# Patient Record
Sex: Male | Born: 1945 | Race: White | Hispanic: No | Marital: Married | Smoking: Never smoker
Health system: Southern US, Community
[De-identification: ages and names within clinical notes are randomized; demographics above are authoritative.]

## PROBLEM LIST (undated history)

## (undated) DIAGNOSIS — I10 Essential (primary) hypertension: Secondary | ICD-10-CM

## (undated) DIAGNOSIS — Z8249 Family history of ischemic heart disease and other diseases of the circulatory system: Secondary | ICD-10-CM

## (undated) HISTORY — DX: Family history of ischemic heart disease and other diseases of the circulatory system: Z82.49

---

## 2015-02-17 ENCOUNTER — Emergency Department
Admission: EM | Admit: 2015-02-17 | Discharge: 2015-02-17 | Disposition: A | Discharge status: Home / Self Care | Payer: Medicare Other | Attending: Emergency Medicine | Admitting: Emergency Medicine

## 2015-02-17 ENCOUNTER — Encounter: Payer: Self-pay | Admitting: Emergency Medicine

## 2015-02-17 ENCOUNTER — Emergency Department: Payer: Medicare Other

## 2015-02-17 DIAGNOSIS — R1032 Left lower quadrant pain: Secondary | ICD-10-CM | POA: Diagnosis not present

## 2015-02-17 DIAGNOSIS — I10 Essential (primary) hypertension: Secondary | ICD-10-CM | POA: Insufficient documentation

## 2015-02-17 DIAGNOSIS — M5416 Radiculopathy, lumbar region: Secondary | ICD-10-CM | POA: Insufficient documentation

## 2015-02-17 DIAGNOSIS — R109 Unspecified abdominal pain: Secondary | ICD-10-CM | POA: Diagnosis present

## 2015-02-17 HISTORY — DX: Essential (primary) hypertension: I10

## 2015-02-17 LAB — CBC WITH DIFFERENTIAL/PLATELET
BASOS ABS: 0.1 10*3/uL (ref 0–0.1)
EOS ABS: 0 10*3/uL (ref 0–0.7)
Eosinophils Relative: 0 %
HCT: 50.8 % (ref 40.0–52.0)
Hemoglobin: 17.1 g/dL (ref 13.0–18.0)
Lymphocytes Relative: 10 %
Lymphs Abs: 1 10*3/uL (ref 1.0–3.6)
MCH: 27.3 pg (ref 26.0–34.0)
MCHC: 33.6 g/dL (ref 32.0–36.0)
MCV: 81.2 fL (ref 80.0–100.0)
Monocytes Absolute: 0.6 10*3/uL (ref 0.2–1.0)
Monocytes Relative: 6 %
Neutro Abs: 7.8 10*3/uL — ABNORMAL HIGH (ref 1.4–6.5)
Neutrophils Relative %: 83 %
Platelets: 217 10*3/uL (ref 150–440)
RBC: 6.26 MIL/uL — AB (ref 4.40–5.90)
RDW: 14.5 % (ref 11.5–14.5)
WBC: 9.4 10*3/uL (ref 3.8–10.6)

## 2015-02-17 LAB — BASIC METABOLIC PANEL
ANION GAP: 9 (ref 5–15)
BUN: 15 mg/dL (ref 6–20)
CALCIUM: 10.1 mg/dL (ref 8.9–10.3)
CO2: 23 mmol/L (ref 22–32)
CREATININE: 1.04 mg/dL (ref 0.61–1.24)
Chloride: 104 mmol/L (ref 101–111)
GFR calc Af Amer: >60 mL/min (ref >60)
GFR calc non Af Amer: >60 mL/min (ref >60)
Glucose, Bld: 123 mg/dL — ABNORMAL HIGH (ref 65–99)
Potassium: 4 mmol/L (ref 3.5–5.1)
Sodium: 136 mmol/L (ref 135–145)

## 2015-02-17 LAB — URINALYSIS COMPLETE WITH MICROSCOPIC (ARMC ONLY)
Bacteria, UA: NONE SEEN
Bilirubin Urine: NEGATIVE
Glucose, UA: NEGATIVE mg/dL
Hgb urine dipstick: NEGATIVE
KETONES UR: NEGATIVE mg/dL
Leukocytes, UA: NEGATIVE
NITRITE: NEGATIVE
PH: 5 (ref 5.0–8.0)
PROTEIN: NEGATIVE mg/dL
Specific Gravity, Urine: 1.013 (ref 1.005–1.030)

## 2015-02-17 LAB — LIPASE, BLOOD: Lipase: 30 U/L (ref 22–51)

## 2015-02-17 MED ORDER — HYDROCODONE-ACETAMINOPHEN 5-325 MG PO TABS: 1.0000 | ORAL_TABLET | Freq: Four times a day (QID) | ORAL | Status: DC | PRN

## 2015-02-17 MED ORDER — IBUPROFEN 800 MG PO TABS: 800.0000 mg | ORAL_TABLET | Freq: Three times a day (TID) | ORAL | Status: DC | PRN

## 2015-02-17 MED ORDER — IBUPROFEN 600 MG PO TABS
ORAL_TABLET | ORAL | Status: AC
  Administered 2015-02-17: 600 mg via ORAL
  Filled 2015-02-17: qty 1

## 2015-02-17 MED ORDER — IBUPROFEN 600 MG PO TABS
600.0000 mg | ORAL_TABLET | Freq: Once | ORAL | Status: AC
  Administered 2015-02-17: 600 mg via ORAL

## 2015-02-17 NOTE — ED Notes (Signed)
Pt reports that he has been having left flank pain that radiates to the LLQ. He denies any GU symptom or blood in urine. Denies V/D but has been getting nauseated.

## 2015-02-17 NOTE — Discharge Instructions (Signed)
No certain cause was found for your left back pain which wraps around to the front. However your exam and evaluation are reassuring. I suspect a pinched nerve in the back called radiculopathy. You're being started on anti-inflammatory ibuprofen regimen 800 mg 3 times per day for 1 week and then as needed for pain. I suggest following up with primary care provider you're referred to primary care clinic. You may need further evaluation with imaging such as an MRI. Return to the emergency department for any new or worsening pain weakness numbness or incontinence.

## 2015-02-17 NOTE — ED Provider Notes (Signed)
[facility] Emergency Department Provider Note    ____________________________________________  Time seen: 11 AM  I have reviewed the triage vital signs and the nursing notes.   HISTORY  Chief Complaint Flank Pain       HPI Alex Hughes is a 69 y.o. male patient who presents with left lower quadrant and left flank pain for several days on and off. At times is severe never goes completely away nothing makes it better or worse. Never had this before. Does not radiate down the leg. No specific lower spine pain. Denies urinary symptoms     Past Medical History  Diagnosis Date  . Hypertension     There are no active problems to display for this patient.   History reviewed. No pertinent past surgical history.  No current outpatient prescriptions on file.  Allergies Sulfur  History reviewed. No pertinent family history.  Social History History  Substance Use Topics  . Smoking status: Never Smoker   . Smokeless tobacco: Not on file  . Alcohol Use: No    Review of Systems  Constitutional: Negative for fever. Eyes: Negative for visual changes. ENT: Negative for sore throat. Cardiovascular: Negative for chest pain. Respiratory: Negative for shortness of breath. Gastrointestinal: Negative vomiting and diarrhea. Genitourinary: Negative for dysuria. Musculoskeletal: Negative for back pain. Does report left flank pain Skin: Negative for rash. Neurological: Negative for headaches, focal weakness or numbness.   10-point ROS otherwise negative.  ____________________________________________   PHYSICAL EXAM:  VITAL SIGNS: ED Triage Vitals  Enc Vitals Group     BP 02/17/15 0835 178/117 mmHg     Pulse Rate 02/17/15 0835 90     Resp 02/17/15 0835 20     Temp 02/17/15 0835 98.3 F (36.8 C)     Temp Source 02/17/15 0835 Oral     SpO2 02/17/15 0835 97 %     Weight 02/17/15 0835 215 lb (97.523 kg)     Height 02/17/15 0835 5' 11"  (1.803  m)     Head Cir --      Peak Flow --      Pain Score 02/17/15 0836 7     Pain Loc --      Pain Edu? --      Excl. in [place]? --      Constitutional: Alert and oriented. Well appearing and in no distress. Eyes: Conjunctivae are normal. PERRL. Normal extraocular movements. ENT   Head: Normocephalic and atraumatic.   Nose: No congestion/rhinnorhea.   Mouth/Throat: Mucous membranes are moist.   Neck: No stridor. Hematological/Lymphatic/Immunilogical: No cervical lymphadenopathy. Cardiovascular: Normal rate, regular rhythm. Normal and symmetric distal pulses are present in all extremities. No murmurs, rubs, or gallops. Respiratory: Normal respiratory effort without tachypnea nor retractions. Breath sounds are clear and equal bilaterally. No wheezes/rales/rhonchi. Gastrointestinal: Soft mild tenderness left lower quadrant. No distention. No abdominal bruits. There is no CVA tenderness. Genitourinary: Musculoskeletal: Nontender with normal range of motion in all extremities. No joint effusions.  No lower extremity tenderness nor edema. Neurologic:  Normal speech and language. No gross focal neurologic deficits are appreciated. Speech is normal. No gait instability. Skin:  Skin is warm, dry and intact. No rash noted. Psychiatric: Mood and affect are normal. Speech and behavior are normal. Patient exhibits appropriate insight and judgment.  ____________________________________________    LABS (pertinent positives/negatives)  CBC and met b no acute other modalities  ____________________________________________   EKG    ____________________________________________    RADIOLOGY  Reviewed radiology results CT  stone study of the abdomen negative for stone and negative for acute cause of left-sided flank and abdominal pain  ____________________________________________   PROCEDURES  Procedure(s) performed: None  Critical Care performed:  No  ____________________________________________   INITIAL IMPRESSION / ASSESSMENT AND PLAN / ED COURSE  Pertinent labs & imaging results that were available during my care of the patient were reviewed by me and considered in my medical decision making (see chart for details).  Patient presented with several weeks of left-sided lower back and/or flank pain right wrapping around into the left-sided abdomen. He thinks he has been actually worsening. Doesn't feel like previous sciatica to him and there are no weakness numbness incontinence symptoms. Based on symptoms I felt I ureterolithiasis was a possible cause. Did not suspect acute aortic emergency. His labs are reassuring and his noncontrast CT the abdomen is also reassuring. I discussed this with the patient and let him know that I suspect radiculopathy including a pinched nerve in the upper portion of the lumbar spine. He can be referred to a primary care physician. I discussed return precautions with the patient we will treat conservatively with anti-inflammatory ibuprofen and short-term narcotic pain control.  ____________________________________________   FINAL CLINICAL IMPRESSION(S) / ED DIAGNOSES  Final diagnoses:  Radiculopathy of lumbar region     Lisa Roca, MD 02/17/15 1404

## 2015-08-24 ENCOUNTER — Emergency Department
Admission: EM | Admit: 2015-08-24 | Discharge: 2015-08-24 | Disposition: A | Discharge status: Home / Self Care | Payer: Medicare Other | Attending: Emergency Medicine | Admitting: Emergency Medicine

## 2015-08-24 ENCOUNTER — Encounter: Payer: Self-pay | Admitting: Emergency Medicine

## 2015-08-24 DIAGNOSIS — Y9289 Other specified places as the place of occurrence of the external cause: Secondary | ICD-10-CM | POA: Insufficient documentation

## 2015-08-24 DIAGNOSIS — Z79899 Other long term (current) drug therapy: Secondary | ICD-10-CM | POA: Insufficient documentation

## 2015-08-24 DIAGNOSIS — Z23 Encounter for immunization: Secondary | ICD-10-CM | POA: Diagnosis not present

## 2015-08-24 DIAGNOSIS — Y9389 Activity, other specified: Secondary | ICD-10-CM | POA: Diagnosis not present

## 2015-08-24 DIAGNOSIS — W231XXA Caught, crushed, jammed, or pinched between stationary objects, initial encounter: Secondary | ICD-10-CM | POA: Insufficient documentation

## 2015-08-24 DIAGNOSIS — S61210A Laceration without foreign body of right index finger without damage to nail, initial encounter: Secondary | ICD-10-CM | POA: Diagnosis not present

## 2015-08-24 DIAGNOSIS — Z7982 Long term (current) use of aspirin: Secondary | ICD-10-CM | POA: Insufficient documentation

## 2015-08-24 DIAGNOSIS — I10 Essential (primary) hypertension: Secondary | ICD-10-CM | POA: Diagnosis not present

## 2015-08-24 DIAGNOSIS — Y998 Other external cause status: Secondary | ICD-10-CM | POA: Diagnosis not present

## 2015-08-24 MED ORDER — TRAMADOL HCL 50 MG PO TABS: 50.0000 mg | ORAL_TABLET | Freq: Four times a day (QID) | ORAL | Status: DC | PRN

## 2015-08-24 MED ORDER — BACITRACIN-NEOMYCIN-POLYMYXIN 400-5-5000 EX OINT: TOPICAL_OINTMENT | Freq: Once | CUTANEOUS | Status: DC

## 2015-08-24 MED ORDER — LIDOCAINE HCL (PF) 1 % IJ SOLN
INTRAMUSCULAR | Status: AC
  Filled 2015-08-24: qty 5

## 2015-08-24 MED ORDER — TETANUS-DIPHTHERIA TOXOIDS TD 5-2 LFU IM INJ
0.5000 mL | INJECTION | Freq: Once | INTRAMUSCULAR | Status: AC
  Administered 2015-08-24: 0.5 mL via INTRAMUSCULAR
  Filled 2015-08-24: qty 0.5

## 2015-08-24 NOTE — ED Notes (Signed)
Pt presents with laceration to right index finger, sliced on a car door.

## 2015-08-24 NOTE — ED Provider Notes (Signed)
[facility] Emergency Department Provider Note  ____________________________________________  Time seen: Approximately 8:21 AM  I have reviewed the triage vital signs and the nursing notes.   HISTORY  Chief Complaint Laceration    HPI Alex Hughes is a 69 y.o. male pain and laceration to the right index finger secondary to being slammed in a truck door. Incident occurred prior to arrival. Patient denies any loss sensation or loss of function of the affected digit. Patient is right-hand dominant. Patient rates the pain as a 4/10. Except for pressure dressing no other palliative measures performed. Patient states tetanus is not up-to-date.   Past Medical History  Diagnosis Date  . Hypertension     There are no active problems to display for this patient.   History reviewed. No pertinent past surgical history.  Current Outpatient Rx  Name  Route  Sig  Dispense  Refill  . amLODipine (NORVASC) 5 MG tablet   Oral   Take 5 mg by mouth daily.         [name] aspirin 81 MG tablet   Oral   Take 81 mg by mouth daily.         [name] HYDROcodone-acetaminophen (NORCO/VICODIN) 5-325 MG per tablet   Oral   Take 1-2 tablets by mouth every 6 (six) hours as needed for moderate pain.   10 tablet   0   . ibuprofen (ADVIL,MOTRIN) 800 MG tablet   Oral   Take 1 tablet (800 mg total) by mouth every 8 (eight) hours as needed.   30 tablet   0   . metoprolol tartrate (LOPRESSOR) 25 MG tablet   Oral   Take 25 mg by mouth 2 (two) times daily.            Allergies Lisinopril and Sulfur  No family history on file.  Social History Social History  Substance Use Topics  . Smoking status: Never Smoker   . Smokeless tobacco: None  . Alcohol Use: No    Review of Systems Constitutional: No fever/chills Eyes: No visual changes. ENT: No sore throat. Cardiovascular: Denies chest pain. Respiratory: Denies shortness of breath. Gastrointestinal: No abdominal pain.  No  nausea, no vomiting.  No diarrhea.  No constipation. Genitourinary: Negative for dysuria. Musculoskeletal: Negative for back pain. Skin: Negative for rash. Neurological: Negative for headaches, focal weakness or numbness. Endocrine:Hypertension Hematological/Lymphatic: Allergic/Immunilogical: The medication  10-point ROS otherwise negative.  ____________________________________________   PHYSICAL EXAM:  VITAL SIGNS: ED Triage Vitals  Enc Vitals Group     BP 08/24/15 0732 164/97 mmHg     Pulse Rate 08/24/15 0732 73     Resp 08/24/15 0732 20     Temp 08/24/15 0732 98.3 F (36.8 C)     Temp Source 08/24/15 0732 Oral     SpO2 08/24/15 0732 97 %     Weight 08/24/15 0732 215 lb (97.523 kg)     Height 08/24/15 0732 5\' 11"  (1.803 m)     Head Cir --      Peak Flow --      Pain Score --      Pain Loc --      Pain Edu? --      Excl. in [place]? --     Constitutional: Alert and oriented. Well appearing and in no acute distress. Eyes: Conjunctivae are normal. PERRL. EOMI. Head: Atraumatic. Nose: No congestion/rhinnorhea. Mouth/Throat: Mucous membranes are moist.  Oropharynx non-erythematous. Neck: No stridor.  No cervical spine tenderness to palpation. Hematological/Lymphatic/Immunilogical:  No cervical lymphadenopathy. Cardiovascular: Normal rate, regular rhythm. Grossly normal heart sounds.  Good peripheral circulation. Limited blood pressure Respiratory: Normal respiratory effort.  No retractions. Lungs CTAB. Gastrointestinal: Soft and nontender. No distention. No abdominal bruits. No CVA tenderness. Musculoskeletal: No lower extremity tenderness nor edema.  No joint effusions. Neurologic:  Normal speech and language. No gross focal neurologic deficits are appreciated. No gait instability. Skin:  Skin is warm, dry and intact. No rash noted. 1.5 cm laceration palmar aspect of second digit right hand. Psychiatric: Mood and affect are normal. Speech and behavior are  normal.  ____________________________________________   LABS (all labs ordered are listed, but only abnormal results are displayed)  Labs Reviewed - No data to display ____________________________________________  EKG   ____________________________________________  RADIOLOGY   ____________________________________________   PROCEDURES  Procedure(s) performed: See procedure note  Critical Care performed: No LACERATION REPAIR Performed by: Sable Feil Authorized by: Sable Feil Consent: Verbal consent obtained. Risks and benefits: risks, benefits and alternatives were discussed Consent given by: patient Patient identity confirmed: provided demographic data Prepped and Draped in normal sterile fashion Wound explored  Laceration Location: Right index finger  Laceration Length: 1.5 cm Anesthesia: local infiltration  Local anesthetic: lidocaine 1% without epinephrine  Anesthetic total: 5 ML Irrigation method: syringe Amount of cleaning: standard  Skin closure: 4-0 nylon  Number of sutures: 11 Technique: Interrupted  Patient tolerance: Patient tolerated the procedure well with no immediate complications.  ____________________________________________   INITIAL IMPRESSION / ASSESSMENT AND PLAN / ED COURSE  Pertinent labs & imaging results that were available during my care of the patient were reviewed by me and considered in my medical decision making (see chart for details).  Laceration index finger right hand. Area was was sutured and patient remained neurovascularly intact status post procedure. Patient given discharge instructions home care and advised return back in 10 days for suture removal. Patient given prescription for tramadol take as needed for pain. Patient advised return back to ER sooner if his wound reopens. ____________________________________________   FINAL CLINICAL IMPRESSION(S) / ED DIAGNOSES  Final diagnoses:  Laceration of right index  finger w/o foreign body w/o damage to nail, initial encounter      Sable Feil, PA-C 08/24/15 0900  Carrie Mew, MD 08/25/15 [phone]

## 2015-08-24 NOTE — ED Notes (Addendum)
States he shut his right index finger in truck door this am  Laceration noted finger  approx 1 in.

## 2015-08-24 NOTE — Discharge Instructions (Signed)
Laceration Care, Adult  A laceration is a cut that goes through all layers of the skin. The cut also goes into the tissue that is right under the skin. Some cuts heal on their own. Others need to be closed with stitches (sutures), staples, skin adhesive strips, or wound glue. Taking care of your cut lowers your risk of infection and helps your cut to heal better.  HOW TO TAKE CARE OF YOUR CUT  For stitches or staples:  · Keep the wound clean and dry.  · If you were given a bandage (dressing), you should change it at least one time per day or as told by your doctor. You should also change it if it gets wet or dirty.  · Keep the wound completely dry for the first 24 hours or as told by your doctor. After that time, you may take a shower or a bath. However, make sure that the wound is not soaked in water until after the stitches or staples have been removed.  · Clean the wound one time each day or as told by your doctor:    Wash the wound with soap and water.    Rinse the wound with water until all of the soap comes off.    Pat the wound dry with a clean towel. Do not rub the wound.  · After you clean the wound, put a thin layer of antibiotic ointment on it as told by your doctor. This ointment:    Helps to prevent infection.    Keeps the bandage from sticking to the wound.  · Have your stitches or staples removed as told by your doctor.  If your doctor used skin adhesive strips:   · Keep the wound clean and dry.  · If you were given a bandage, you should change it at least one time per day or as told by your doctor. You should also change it if it gets dirty or wet.  · Do not get the skin adhesive strips wet. You can take a shower or a bath, but be careful to keep the wound dry.  · If the wound gets wet, pat it dry with a clean towel. Do not rub the wound.  · Skin adhesive strips fall off on their own. You can trim the strips as the wound heals. Do not remove any strips that are still stuck to the wound. They will  fall off after a while.  If your doctor used wound glue:  · Try to keep your wound dry, but you may briefly wet it in the shower or bath. Do not soak the wound in water, such as by swimming.  · After you take a shower or a bath, gently pat the wound dry with a clean towel. Do not rub the wound.  · Do not do any activities that will make you really sweaty until the skin glue has fallen off on its own.  · Do not apply liquid, cream, or ointment medicine to your wound while the skin glue is still on.  · If you were given a bandage, you should change it at least one time per day or as told by your doctor. You should also change it if it gets dirty or wet.  · If a bandage is placed over the wound, do not let the tape for the bandage touch the skin glue.  · Do not pick at the glue. The skin glue usually stays on for 5-10 days. Then, it   falls off of the skin.  General Instructions   · To help prevent scarring, make sure to cover your wound with sunscreen whenever you are outside after stitches are removed, after adhesive strips are removed, or when wound glue stays in place and the wound is healed. Make sure to wear a sunscreen of at least 30 SPF.  · Take over-the-counter and prescription medicines only as told by your doctor.  · If you were given antibiotic medicine or ointment, take or apply it as told by your doctor. Do not stop using the antibiotic even if your wound is getting better.  · Do not scratch or pick at the wound.  · Keep all follow-up visits as told by your doctor. This is important.  · Check your wound every day for signs of infection. Watch for:    Redness, swelling, or pain.    Fluid, blood, or pus.  · Raise (elevate) the injured area above the level of your heart while you are sitting or lying down, if possible.  GET HELP IF:  · You got a tetanus shot and you have any of these problems at the injection site:    Swelling.    Very bad pain.    Redness.    Bleeding.  · You have a fever.  · A wound that was  closed breaks open.  · You notice a bad smell coming from your wound or your bandage.  · You notice something coming out of the wound, such as wood or glass.  · Medicine does not help your pain.  · You have more redness, swelling, or pain at the site of your wound.  · You have fluid, blood, or pus coming from your wound.  · You notice a change in the color of your skin near your wound.  · You need to change the bandage often because fluid, blood, or pus is coming from the wound.  · You start to have a new rash.  · You start to have numbness around the wound.  GET HELP RIGHT AWAY IF:  · You have very bad swelling around the wound.  · Your pain suddenly gets worse and is very bad.  · You notice painful lumps near the wound or on skin that is anywhere on your body.  · You have a red streak going away from your wound.  · The wound is on your hand or foot and you cannot move a finger or toe like you usually can.  · The wound is on your hand or foot and you notice that your fingers or toes look pale or bluish.     This information is not intended to replace advice given to you by your health care provider. Make sure you discuss any questions you have with your health care provider.     Document Released: 03/21/2008 Document Revised: 02/17/2015 Document Reviewed: 09/29/2014  Elsevier Interactive Patient Education ©2016 [employer]

## 2015-09-02 ENCOUNTER — Ambulatory Visit
Admission: EM | Admit: 2015-09-02 | Discharge: 2015-09-02 | Disposition: A | Discharge status: Home / Self Care | Payer: Medicare Other

## 2015-09-02 NOTE — ED Notes (Signed)
For suture removal-sutures right 2nd finger. Wound fairly well healed. 10 sutures removed without difficulty and steri strips applied.

## 2016-11-30 ENCOUNTER — Encounter: Payer: Self-pay | Admitting: Family Medicine

## 2016-11-30 ENCOUNTER — Ambulatory Visit (INDEPENDENT_AMBULATORY_CARE_PROVIDER_SITE_OTHER): Payer: PPO | Admitting: Family Medicine

## 2016-11-30 DIAGNOSIS — I10 Essential (primary) hypertension: Secondary | ICD-10-CM | POA: Diagnosis not present

## 2016-11-30 DIAGNOSIS — Z8249 Family history of ischemic heart disease and other diseases of the circulatory system: Secondary | ICD-10-CM | POA: Diagnosis not present

## 2016-11-30 DIAGNOSIS — E663 Overweight: Secondary | ICD-10-CM | POA: Diagnosis not present

## 2016-11-30 DIAGNOSIS — H9312 Tinnitus, left ear: Secondary | ICD-10-CM

## 2016-11-30 DIAGNOSIS — M5431 Sciatica, right side: Secondary | ICD-10-CM | POA: Diagnosis not present

## 2016-11-30 DIAGNOSIS — H9319 Tinnitus, unspecified ear: Secondary | ICD-10-CM | POA: Insufficient documentation

## 2016-11-30 DIAGNOSIS — M5416 Radiculopathy, lumbar region: Secondary | ICD-10-CM | POA: Insufficient documentation

## 2016-11-30 HISTORY — DX: Family history of ischemic heart disease and other diseases of the circulatory system: Z82.49

## 2016-11-30 MED ORDER — NAPROXEN 500 MG PO TABS: 500.0000 mg | ORAL_TABLET | Freq: Two times a day (BID) | ORAL | 0 refills | Status: DC

## 2016-11-30 NOTE — Progress Notes (Signed)
Date:  11/30/2016   Name:  Alex Hughes   DOB:  November 27, 1945   MRN:  [id number]  PCP:  Adline Potter, MD    Chief Complaint: Establish Care and Leg Pain (Pt complains of shooting pain from hip down to the "ball of this foot of Rt leg. This was a previous problem from 2 years ago.)   History of Present Illness:  This is a 71 y.o. male c/o 3 week hx R back and leg pain worse with standing, radiates to R foot, better with sitting. Had similar sxs two years ago, seen ED, CT showed non-obstructing kidney stones and lumbar DJD only, sxs resolved with ibuprofen. Ibuprofen 400 mg bid helping some now. Hx HTN and tinnitus followed by [place], did not take usual BP meds this PM, BP at home usually 130s/70s. Father died CVA during CABG age 56, mother died 31s from worry, sister healthy. Imms UTD through [place], does annual FOBT, last seen at [place] 6 months ago.  Review of Systems:  Review of Systems  Constitutional: Negative for chills and fever.  HENT: Negative for ear pain and sore throat.   Eyes: Negative for pain.  Respiratory: Negative for cough and shortness of breath.   Cardiovascular: Negative for chest pain and leg swelling.  Gastrointestinal: Negative for abdominal pain.  Endocrine: Negative for polydipsia and polyuria.  Genitourinary: Negative for dysuria.  Musculoskeletal: Negative for myalgias.  Neurological: Negative for tremors, syncope and light-headedness.    Patient Active Problem List   Diagnosis Date Noted  . Right sided sciatica 11/30/2016  . Tinnitus 11/30/2016  . Hypertension 11/30/2016  . FH: heart disease 11/30/2016  . Overweight (BMI 25.0-29.9) 11/30/2016    Prior to Admission medications   Medication Sig Start Date End Date Taking? Authorizing Provider  amLODipine (NORVASC) 5 MG tablet Take 5 mg by mouth. One tablet every morning and half tablet every afternoon   Yes Historical Provider, MD  metoprolol tartrate (LOPRESSOR) 25 MG tablet Take 25 mg by mouth 2 (two) times daily.     Yes Historical Provider, MD  naproxen (NAPROSYN) 500 MG tablet Take 1 tablet (500 mg total) by mouth 2 (two) times daily with a meal. 11/30/16   Adline Potter, MD    Allergies  Allergen Reactions  . Lisinopril     Facial Swelling  . Sulfur Other (See Comments)    Urinated blood    History reviewed. No pertinent surgical history.  Social History  Substance Use Topics  . Smoking status: Never Smoker  . Smokeless tobacco: Never Used  . Alcohol use No    Family History  Problem Relation Age of Onset  . Diabetes Father   . Heart disease Father     Medication list has been reviewed and updated.  Physical Examination: BP (!) 158/96   Pulse 68   Ht 6' (1.829 m)   Wt 205 lb (93 kg)   SpO2 97%   BMI 27.80 kg/m   Physical Exam  Constitutional: He is oriented to person, place, and time. He appears well-developed and well-nourished.  HENT:  Head: Normocephalic and atraumatic.  Right Ear: External ear normal.  Left Ear: External ear normal.  Nose: Nose normal.  Mouth/Throat: Oropharynx is clear and moist.  TMs clear  Eyes: Conjunctivae and EOM are normal. Pupils are equal, round, and reactive to light. No scleral icterus.  Neck: Normal range of motion. Neck supple.  Cardiovascular: Normal rate, regular rhythm and normal heart sounds.   Pulmonary/Chest: Effort normal and  breath sounds normal.  Abdominal: Soft. He exhibits no distension and no mass. There is no tenderness.  Musculoskeletal: Normal range of motion. He exhibits no edema.  SLR positive with dorsiflexion on R, negative on L No pain with bent knee flexion or hip rotation  Neurological: He is alert and oriented to person, place, and time. Coordination normal.  Skin: Skin is warm and dry.  Psychiatric: He has a normal mood and affect. His behavior is normal.  Nursing note and vitals reviewed.   Assessment and Plan:  1. Right sided sciatica Naprosyn bid x 2 wks only, call if sxs worsen - Ambulatory referral  to Physical Therapy  2. [facility] of left ear [facility]  3. Essential hypertension Poor control today but ok at home, did not take PM meds, monitor  4. Overweight (BMI 25.0-29.9) Exercise/weight loss discussed  5. FH: heart disease Consider lipids if not done at [place]  Return in about 4 weeks (around 12/28/2016).   Satira Anis. [facility]  11/30/2016

## 2016-12-05 ENCOUNTER — Ambulatory Visit: Payer: PPO | Attending: Family Medicine | Admitting: Physical Therapy

## 2016-12-05 DIAGNOSIS — M5441 Lumbago with sciatica, right side: Secondary | ICD-10-CM | POA: Diagnosis not present

## 2016-12-05 DIAGNOSIS — R293 Abnormal posture: Secondary | ICD-10-CM | POA: Diagnosis not present

## 2016-12-05 DIAGNOSIS — M6281 Muscle weakness (generalized): Secondary | ICD-10-CM | POA: Diagnosis not present

## 2016-12-05 NOTE — Therapy (Signed)
[facility] [address] [place], [place], [address] Phone: [phone]   Fax:  [phone]  Physical Therapy Evaluation  Patient Details  Name: Alex Hughes MRN: [id number] Date of Birth: 04/23/1946 Referring Provider: Adline Potter, M.D.   Encounter Date: 12/05/2016      PT End of Session - 12/05/16 1536    Visit Number 1   Number of Visits 8   Date for PT Re-Evaluation 01/02/17   Authorization - Visit Number 1   Authorization - Number of Visits 10   PT Start Time 1586   PT Stop Time 1520   PT Time Calculation (min) 67 min   Activity Tolerance Patient tolerated treatment well;No increased pain   Behavior During Therapy WFL for tasks assessed/performed      Past Medical History:  Diagnosis Date  . Hypertension     No past surgical history on file.  There were no vitals filed for this visit.       Subjective Assessment - 12/05/16 1422    Subjective Pt. presented to physical therapy clinic today with no reports of current pain and no assistive device. Pt. reports worse pain as 8/10 shooting pain down R LE into R foot. Pt. states pain is often aggravated by standing up. Pt. currently works part-time and enjoys spending time with his grandkids.    Limitations Standing;Walking;Lifting;House hold activities   Patient Stated Goals Return to normal household activities    Currently in Pain? No/denies     Modified Oswestry Low Back Pain Disability Questionnaire: 40%  Seated LE MMT:   Right Left  Hip flexion 4-/5 4+/5  Hip Abduction 4+/5 4+/5  Hip Adduction 4+/5 little bit of pain 4+/5  Knee Extension  4+/5 Little bit of pain 4+/5  Knee Flexion 4+/5 4+/5  DF 5/5 5/5  PF 20 heel raises: laterally weight shift over 5th met 17 heel raises before fatigue     Standing Trunk ROM: WFL  Supine: Straight leg raise:PROM  Left: : WFL hamstring length with and w/o DF  Right: WFL hamstring length- get numbness into foot w  and w/o DF  Straight leg raise test: Active  Right: -, good ROM  Left knee to chest: no pain Right knee to chest: get slight numbness into toes Bony alignment: aligned malleoli, tibial tuberosity, and ASIS Bridge: able to perform, cramping on left side LE rotation stretch: little pull to the left, no pain to the right.  Piriformis: right piriformis tightness  Prone: B quad tightness w/ Ely's test R piriformis tender to palpation, STM to R hip/glute relieved pain  No tenderness with mod. Hypomobility of L2-L5 with grade I-II CPA/UPA  There.Ex.: Supine/seated stretching of R piriformis 30 sec. Static holds 3x each, prone press-ups 5x with 20 sec. Holds.  See handouts.       [facility] PT Assessment - 12/05/16 0001      Assessment   Medical Diagnosis Right sided sciatica    Referring Provider Adline Potter, M.D.    Onset Date/Surgical Date 11/14/16   Next MD Visit 01/04/17   Prior Therapy yes     Precautions   Precautions None     Restrictions   Weight Bearing Restrictions No     Balance Screen   Has the patient fallen in the past 6 months No   Has the patient had a decrease in activity level because of a fear of falling?  Yes   Is the patient reluctant to leave their  home because of a fear of falling?  No     Home Environment   Living Environment Private residence   Living Arrangements Spouse/significant other            PT Education - 12/05/16 1535    Education provided Yes   Education Details see handout   Person(s) Educated Patient   Methods Explanation;Demonstration;Handout   Comprehension Verbalized understanding;Returned demonstration             PT Long Term Goals - 12/05/16 1655      PT LONG TERM GOAL #1   Title Pt. will have 3/10 pain or less with sit to stand to promote pain-free mobility   Baseline Pt. reports as high as 8/10 pain with sit to stand   Time 4   Period Weeks   Status New     PT LONG TERM GOAL #2   Title Pt. will score <20% on  mODI to decrease self-perceived disability of the low back.   Baseline 2/19: 40% (severe disability)   Time 4   Period Weeks   Status New     PT LONG TERM GOAL #3   Title Pt. will demonstrate proper box lifting techniques in PT clinic with no increase in pain to promote proper body mechanics at home.   Baseline Pt. unaware of proper lifting mechanics   Time 4   Period Weeks   Status New     PT LONG TERM GOAL #4   Title Pt. will increase R hip flexion MMT to grossly 4+/5 to promote a more normalized gait pattern.    Baseline Pt. 4-/5 R hip flexion 12/05/16   Time 4   Period Weeks   Status New             Plan - 12/05/16 1538    Clinical Impression Statement Patient is a pleasant 71 year old male who presents to physical therapy evaluation for right leg pain with a hx of sciatica. 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 Right piriformis is tight with concurrent nerve pain. Patient has an antalgic gait with RLE externally rotated indicative of piriformis syndrome. Patient scored a 40% on the Modified Oswestry Low back Pain questionnaire placing him in the self perceived severe disability category. Patient has slight hypomobility of lumbar spine with tight right paraspinals and inflamed right piriformis.  Patient had a (-) Active SLR but had tingling and numbness with PROM SLR.  Pain was noted with RLE MMT. In prone, pt. had relief of back pain during back extension on elbows. Pt. educated on several methods of stretching the R piriformis muscle in both supine and seated. Patient will benefit from skilled physical therapy for pain control, muscle length, and improved capacity to stand.    Rehab Potential Good   PT Frequency 2x / week   PT Duration 4 weeks   PT Treatment/Interventions ADLs/Self Care Home Management;Cryotherapy;Electrical Stimulation;Moist Heat;Gait training;Functional mobility Scientist, forensic;Therapeutic exercise;Balance  training;Neuromuscular re-education;Patient/family education;Manual techniques;Passive range of motion;Dry needling   PT Next Visit Plan STM to R glute/hip/low back.  Review HEP.     PT Home Exercise Plan see handout    Consulted and Agree with Plan of Care Patient      Patient will benefit from skilled therapeutic intervention in order to improve the following deficits and impairments:  Abnormal gait, Decreased endurance, Decreased mobility, Hypomobility, Decreased range of motion, Improper body mechanics, Decreased activity tolerance, Decreased strength, Impaired flexibility, Postural  dysfunction, Pain  Visit Diagnosis: Acute right-sided low back pain with right-sided sciatica  Abnormal posture  Muscle weakness (generalized)      G-Codes - 12/24/2016 1732    Functional Assessment Tool Used Clinical judgement   Functional Assessment Tool Used mODI/ pain/ muscle weakness/ joint hypomobility.     Functional Limitation Mobility: Walking and moving around   Mobility: Walking and Moving Around Current Status [phone]) At least 20 percent but less than 40 percent impaired, limited or restricted   Mobility: Walking and Moving Around Goal Status [phone]) At least 1 percent but less than 20 percent impaired, limited or restricted       Problem List Patient Active Problem List   Diagnosis Date Noted  . Right sided sciatica 11/30/2016  . Tinnitus 11/30/2016  . Hypertension 11/30/2016  . FH: heart disease 11/30/2016  . Overweight (BMI 25.0-29.9) 11/30/2016   Pura Spice, PT, DPT # [id number] SPT 12/24/2016, 5:33 PM  [facility] [address] [place], [place], [address] Phone: [phone]   Fax:  [phone]  Name: Alex Hughes MRN: [id number] Date of Birth: Sep 05, 1946

## 2016-12-07 ENCOUNTER — Ambulatory Visit: Payer: PPO | Admitting: Physical Therapy

## 2016-12-07 DIAGNOSIS — M6281 Muscle weakness (generalized): Secondary | ICD-10-CM

## 2016-12-07 DIAGNOSIS — M5441 Lumbago with sciatica, right side: Secondary | ICD-10-CM | POA: Diagnosis not present

## 2016-12-07 DIAGNOSIS — R293 Abnormal posture: Secondary | ICD-10-CM

## 2016-12-07 NOTE — Therapy (Signed)
[facility] [address] [place], [place], [address] Phone: [phone]   Fax:  [phone]  Physical Therapy Treatment  Patient Details  Name: Alex Hughes MRN: [id number] Date of Birth: [birth year] Referring Provider: Adline Potter, M.D.   Encounter Date: 12/07/2016      PT End of Session - 12/07/16 1650    Visit Number 2   Number of Visits 8   Date for PT Re-Evaluation 01/02/17   Authorization - Visit Number 2   Authorization - Number of Visits 10   PT Start Time [id number]   PT Stop Time 1535   PT Time Calculation (min) 49 min   Activity Tolerance Patient tolerated treatment well;No increased pain   Behavior During Therapy WFL for tasks assessed/performed      Past Medical History:  Diagnosis Date  . Hypertension     No past surgical history on file.  There were no vitals filed for this visit.      Subjective Assessment - 12/07/16 1550    Subjective Patient reports being compliant with HEP and been feeling better. Patient states hes been feeling the best he has felt in a long time.    Limitations Standing;Walking;Lifting;House hold activities   Patient Stated Goals Return to normal household activities    Currently in Pain? Yes   Pain Score 1    Pain Location Knee   Pain Orientation Right   Pain Descriptors / Indicators Aching   Pain Radiating Towards foot      Manual: Hamstring stretch RLE, LLE 2x60 seconds Piriformis stretch supine RLE 1x60 Popliteal angle hamstring stretch 1x60 Nerve Glides in supine with RLE in SLR position with Active DF and PF 2x10. RLE in popliteal angle with Active DF and PF 2x10. PA CPAs Grade I-III mobilizations of sacrum, lumbar, and T12-T10 with no pain. Creates relief. PA UPAs Grade I-III mobilizations of lumbar spine to both right and left 30 seconds each level 2x.  Soft tissue massage piriformis 5 minutes  There Ex: Nustep Lvl 6 10 mins (no charge) Standing runner stretch 30  seconds each leg. Hamstring and gluteal stretch elevated front leg 2x30 seconds each. [place] with IT stretch pause of 8 seconds 10x in // bars. Pt report improved pain relief. Seated piriformis stretch 30 seconds multipositional.  Ambulation 80 ft with decreased antalgic gait.   Patient response to medical necessity: Patient will benefit from skilled physical therapy for pain control, muscle length, and improved capacity to stand        PT Long Term Goals - 12/05/16 1655      PT LONG TERM GOAL #1   Title Pt. will have 3/10 pain or less with sit to stand to promote pain-free mobility   Baseline Pt. reports as high as 8/10 pain with sit to stand   Time 4   Period Weeks   Status New     PT LONG TERM GOAL #2   Title Pt. will score <20% on mODI to decrease self-perceived disability of the low back.   Baseline 2/19: 40% (severe disability)   Time 4   Period Weeks   Status New     PT LONG TERM GOAL #3   Title Pt. will demonstrate proper box lifting techniques in PT clinic with no increase in pain to promote proper body mechanics at home.   Baseline Pt. unaware of proper lifting mechanics   Time 4   Period Weeks   Status New  PT LONG TERM GOAL #4   Title Pt. will increase R hip flexion MMT to grossly 4+/5 to promote a more normalized gait pattern.    Baseline Pt. 4-/5 R hip flexion 12/05/16   Time 4   Period Weeks   Status New            Plan - 12/07/16 1651    Clinical Impression Statement Patient presented to therapy today with decreased right leg symptoms. Patient demonstrated compliance with HEP's and has had noted improvements. Gait was more normalized post session with decreased antalgia. Patient tolerated Grade I-III UPAs and CPAs to lumbar spine and sacrum and had resultant improved symptoms.  STM to piriformis created relief of symptoms. Nerve glides improved with repetition. Patient has decreased symptoms in general and would benefit from continued skilled  physical therapy for continued pain/symptom  control, muscle length, and capacity for daily living.   Rehab Potential Good   PT Frequency 2x / week   PT Duration 4 weeks   PT Treatment/Interventions ADLs/Self Care Home Management;Cryotherapy;Electrical Stimulation;Moist Heat;Gait training;Functional mobility Scientist, forensic;Therapeutic exercise;Balance training;Neuromuscular re-education;Patient/family education;Manual techniques;Passive range of motion;Dry needling   PT Next Visit Plan potential d/c. soft tissue, mobs, stretches   PT Home Exercise Plan see handout    Consulted and Agree with Plan of Care Patient      Patient will benefit from skilled therapeutic intervention in order to improve the following deficits and impairments:  Abnormal gait, Decreased endurance, Decreased mobility, Hypomobility, Decreased range of motion, Improper body mechanics, Decreased activity tolerance, Decreased strength, Impaired flexibility, Postural dysfunction, Pain  Visit Diagnosis: Acute right-sided low back pain with right-sided sciatica  Abnormal posture  Muscle weakness (generalized)     Problem List Patient Active Problem List   Diagnosis Date Noted  . Right sided sciatica 11/30/2016  . Tinnitus 11/30/2016  . Hypertension 11/30/2016  . FH: heart disease 11/30/2016  . Overweight (BMI 25.0-29.9) 11/30/2016   Pura Spice, PT, DPT # [id number] Janna Arch, SPT 12/08/2016, 8:38 AM  [facility] [address] [place], [place], [address] Phone: [phone]   Fax:  [phone]  Name: Alex Hughes MRN: [id number] Date of Birth: 1945/11/25

## 2016-12-14 ENCOUNTER — Encounter: Payer: PPO | Admitting: Physical Therapy

## 2016-12-21 ENCOUNTER — Encounter: Payer: PPO | Admitting: Physical Therapy

## 2016-12-28 ENCOUNTER — Encounter: Payer: PPO | Admitting: Physical Therapy

## 2017-01-04 ENCOUNTER — Encounter: Payer: PPO | Admitting: Physical Therapy

## 2017-04-05 ENCOUNTER — Other Ambulatory Visit: Payer: Self-pay | Admitting: Family Medicine

## 2017-04-05 ENCOUNTER — Telehealth: Payer: Self-pay | Admitting: Family Medicine

## 2017-04-05 DIAGNOSIS — M5431 Sciatica, right side: Secondary | ICD-10-CM

## 2017-04-05 NOTE — Telephone Encounter (Signed)
PT referral sent

## 2017-04-05 NOTE — Telephone Encounter (Signed)
Patient was seen back in 11/2016 for back pain and was referred to physical therapy, needs a referral since its been a long time since he was last there and is injured again. Please advise DS.

## 2017-04-12 ENCOUNTER — Encounter: Payer: Self-pay | Admitting: Family Medicine

## 2017-04-12 ENCOUNTER — Ambulatory Visit (INDEPENDENT_AMBULATORY_CARE_PROVIDER_SITE_OTHER): Payer: PPO | Admitting: Family Medicine

## 2017-04-12 VITALS — BP 152/89 | HR 86 | Resp 16 | Ht 72.0 in | Wt 199.0 lb

## 2017-04-12 DIAGNOSIS — M21371 Foot drop, right foot: Secondary | ICD-10-CM | POA: Diagnosis not present

## 2017-04-12 DIAGNOSIS — E663 Overweight: Secondary | ICD-10-CM

## 2017-04-12 DIAGNOSIS — R739 Hyperglycemia, unspecified: Secondary | ICD-10-CM | POA: Diagnosis not present

## 2017-04-12 DIAGNOSIS — I10 Essential (primary) hypertension: Secondary | ICD-10-CM | POA: Diagnosis not present

## 2017-04-12 DIAGNOSIS — M5416 Radiculopathy, lumbar region: Secondary | ICD-10-CM

## 2017-04-12 MED ORDER — AMLODIPINE BESYLATE 10 MG PO TABS: 10.0000 mg | ORAL_TABLET | Freq: Every day | ORAL | 3 refills | Status: DC

## 2017-04-12 MED ORDER — NAPROXEN 500 MG PO TABS: 500.0000 mg | ORAL_TABLET | Freq: Two times a day (BID) | ORAL | 2 refills | Status: DC

## 2017-04-12 NOTE — Progress Notes (Signed)
Date:  04/12/2017   Name:  Alex Hughes   DOB:  09-26-1946   MRN:  [id number]  PCP:  Adline Potter, MD    Chief Complaint: Leg Pain (R leg weakness with sciatica and the foot turned at 45 degree angle on own. Some pain but mostly weakness and no control.  )   History of Present Illness:  This is a 71 y.o. male seen in four month f/u. Had R sciatica last visit, recurrent from 2016, responded well to Naprosyn/PT. Lifted lawnmower two weeks ago with recurrence of pain, yesterday developed R foot drop. Pain tolerable if not moving around too much. HTN remains uncontrolled, taking metoprolol daily instead of bid, gets meds from [place]. Hyperglycemia on blood work from 2016.  Review of Systems:  Review of Systems  Constitutional: Negative for chills and fever.  Respiratory: Negative for cough and shortness of breath.   Cardiovascular: Negative for chest pain and leg swelling.  Genitourinary: Negative for difficulty urinating.  Neurological: Negative for tremors, syncope and light-headedness.    Patient Active Problem List   Diagnosis Date Noted  . Right sided sciatica 11/30/2016  . Tinnitus 11/30/2016  . Hypertension 11/30/2016  . FH: heart disease 11/30/2016  . Overweight (BMI 25.0-29.9) 11/30/2016    Prior to Admission medications   Medication Sig Start Date End Date Taking? Authorizing Provider  amLODipine (NORVASC) 10 MG tablet Take 1 tablet (10 mg total) by mouth daily. 04/12/17  Yes Gerik Coberly, Gwyndolyn Saxon, MD  metoprolol tartrate (LOPRESSOR) 25 MG tablet Take 25 mg by mouth 2 (two) times daily.    Yes [provider]  naproxen (NAPROSYN) 500 MG tablet Take 1 tablet (500 mg total) by mouth 2 (two) times daily with a meal. 04/12/17  Yes Tagen Milby, Gwyndolyn Saxon, MD    Allergies  Allergen Reactions  . Lisinopril     Facial Swelling  . Sulfur Other (See Comments)    Urinated blood    History reviewed. No pertinent surgical history.  Social History  Substance Use Topics  . Smoking  status: Never Smoker  . Smokeless tobacco: Never Used  . Alcohol use No    Family History  Problem Relation Age of Onset  . Diabetes Father   . Heart disease Father     Medication list has been reviewed and updated.  Physical Examination: BP (!) 152/89   Pulse 86   Resp 16   Ht 6' (1.829 m)   Wt 199 lb (90.3 kg)   SpO2 97%   BMI 26.99 kg/m   Physical Exam  Constitutional: He appears well-developed and well-nourished.  Cardiovascular: Normal rate, regular rhythm and normal heart sounds.   Pulmonary/Chest: Effort normal and breath sounds normal.  Neurological: He is alert.  Unable to dorsiflex R foot, R hip flexion weak LLE exam normal  Skin: Skin is warm and dry.  Psychiatric: He has a normal mood and affect. His behavior is normal.  Nursing note and vitals reviewed.   Assessment and Plan:  1. Right lumbar radiculopathy Hx recurrent R sciatica, worsened sxs past 24h, restart Naprosyn - Ambulatory referral to Neurosurgery (urgent)  2. Foot drop, right  3. Essential hypertension Poor control, increase amlodipine to 10 mg daily  4. Overweight (BMI 25.0-29.9) Weight down 6#, cont weight loss  5. Hyperglycemia Consider a1c/lipids next visit  Return in about 4 weeks (around 05/10/2017).  Satira Anis. [facility]  04/12/2017

## 2017-04-13 ENCOUNTER — Other Ambulatory Visit: Payer: Self-pay | Admitting: Family Medicine

## 2017-04-13 DIAGNOSIS — M5416 Radiculopathy, lumbar region: Secondary | ICD-10-CM

## 2017-04-13 MED ORDER — TRAMADOL HCL 50 MG PO TABS: ORAL_TABLET | ORAL | 0 refills | Status: DC

## 2017-04-18 ENCOUNTER — Ambulatory Visit: Payer: PPO | Admitting: Physical Therapy

## 2017-04-18 ENCOUNTER — Telehealth: Payer: Self-pay

## 2017-04-18 ENCOUNTER — Other Ambulatory Visit: Payer: Self-pay | Admitting: Family Medicine

## 2017-04-18 MED ORDER — TRAMADOL HCL 50 MG PO TABS: ORAL_TABLET | ORAL | 0 refills | Status: DC

## 2017-04-18 NOTE — Telephone Encounter (Signed)
Neuro appt has been cancelled for today as they want the MRI done first and that will be Monday. Patient requesting more Tramadol. If he needs to pick up in office please let me know so I can remind him we are off this afternoon but someone will be hjere.

## 2017-04-18 NOTE — Telephone Encounter (Signed)
Refill done.  

## 2017-04-24 ENCOUNTER — Ambulatory Visit
Admission: RE | Admit: 2017-04-24 | Discharge: 2017-04-24 | Disposition: A | Discharge status: Home / Self Care | Payer: PPO | Source: Ambulatory Visit | Attending: Family Medicine | Admitting: Family Medicine

## 2017-04-24 DIAGNOSIS — M48061 Spinal stenosis, lumbar region without neurogenic claudication: Secondary | ICD-10-CM | POA: Diagnosis not present

## 2017-04-24 DIAGNOSIS — M5116 Intervertebral disc disorders with radiculopathy, lumbar region: Secondary | ICD-10-CM | POA: Insufficient documentation

## 2017-04-24 DIAGNOSIS — M5127 Other intervertebral disc displacement, lumbosacral region: Secondary | ICD-10-CM | POA: Diagnosis not present

## 2017-04-24 DIAGNOSIS — M5416 Radiculopathy, lumbar region: Secondary | ICD-10-CM | POA: Diagnosis present

## 2017-04-24 DIAGNOSIS — M5136 Other intervertebral disc degeneration, lumbar region: Secondary | ICD-10-CM | POA: Diagnosis not present

## 2017-04-27 DIAGNOSIS — M5416 Radiculopathy, lumbar region: Secondary | ICD-10-CM | POA: Diagnosis not present

## 2017-05-02 DIAGNOSIS — M545 Low back pain: Secondary | ICD-10-CM | POA: Diagnosis not present

## 2017-05-02 DIAGNOSIS — M5416 Radiculopathy, lumbar region: Secondary | ICD-10-CM | POA: Diagnosis not present

## 2017-05-10 DIAGNOSIS — M545 Low back pain: Secondary | ICD-10-CM | POA: Diagnosis not present

## 2017-05-10 DIAGNOSIS — M5416 Radiculopathy, lumbar region: Secondary | ICD-10-CM | POA: Diagnosis not present

## 2017-05-11 ENCOUNTER — Encounter: Payer: Self-pay | Admitting: Family Medicine

## 2017-05-11 ENCOUNTER — Ambulatory Visit (INDEPENDENT_AMBULATORY_CARE_PROVIDER_SITE_OTHER): Payer: PPO | Admitting: Family Medicine

## 2017-05-11 VITALS — BP 122/82 | HR 68 | Ht 72.0 in | Wt 191.0 lb

## 2017-05-11 DIAGNOSIS — M5416 Radiculopathy, lumbar region: Secondary | ICD-10-CM

## 2017-05-11 DIAGNOSIS — I10 Essential (primary) hypertension: Secondary | ICD-10-CM

## 2017-05-11 DIAGNOSIS — E663 Overweight: Secondary | ICD-10-CM

## 2017-05-11 DIAGNOSIS — M21371 Foot drop, right foot: Secondary | ICD-10-CM | POA: Diagnosis not present

## 2017-05-11 MED ORDER — NAPROXEN 500 MG PO TBEC: 500.0000 mg | DELAYED_RELEASE_TABLET | Freq: Two times a day (BID) | ORAL | 2 refills | Status: DC

## 2017-05-11 MED ORDER — IBUPROFEN 800 MG PO TABS: 800.0000 mg | ORAL_TABLET | Freq: Three times a day (TID) | ORAL | 0 refills | Status: DC | PRN

## 2017-05-11 MED ORDER — TRAMADOL HCL 50 MG PO TABS: 50.0000 mg | ORAL_TABLET | Freq: Four times a day (QID) | ORAL | 0 refills | Status: DC | PRN

## 2017-05-11 NOTE — Addendum Note (Signed)
Addended by: Theresia Majors A on: 05/11/2017 04:00 PM   Modules accepted: Orders

## 2017-05-11 NOTE — Progress Notes (Signed)
Date:  05/11/2017   Name:  Alex Hughes   DOB:  [birth year]   MRN:  [id number]  PCP:  Adline Potter, MD    Chief Complaint: Leg Pain (refill pain meds-Gait and foot control is very unbalanced. Almost tripped over his feet on scale. )   History of Present Illness:  This is a 71 y.o. male seen for one month f/u. Saw NSGY 2 wks ago for radiculopathy with foot drop, placed on Medrol and sent to PT, pain better but still has R foot drop which is not improving. Off Naprosyn and prn tramadol, still taking metoprolol and amlodipine, doesn't mind bid metoprolol.   Review of Systems:  Review of Systems  Constitutional: Negative for chills and fever.  Respiratory: Negative for cough and shortness of breath.   Cardiovascular: Negative for chest pain and leg swelling.  Genitourinary: Negative for difficulty urinating.  Neurological: Negative for syncope and light-headedness.    Patient Active Problem List   Diagnosis Date Noted  . Right foot drop 05/11/2017  . Right lumbar radiculopathy 11/30/2016  . Tinnitus 11/30/2016  . Hypertension 11/30/2016  . FH: heart disease 11/30/2016  . Overweight (BMI 25.0-29.9) 11/30/2016    Prior to Admission medications   Medication Sig Start Date End Date Taking? Authorizing Provider  amLODipine (NORVASC) 10 MG tablet Take by mouth. 04/12/17  Yes [provider]  metoprolol tartrate (LOPRESSOR) 25 MG tablet Take by mouth.   Yes [provider]  naproxen (NAPROXEN DR) 500 MG EC tablet Take 1 tablet (500 mg total) by mouth 2 (two) times daily with a meal. 05/11/17  Yes Kahlyn Shippey, Gwyndolyn Saxon, MD  traMADol (ULTRAM) 50 MG tablet Take 1 tablet (50 mg total) by mouth every 6 (six) hours as needed. 05/11/17  Yes Phuong Moffatt, Gwyndolyn Saxon, MD    Allergies  Allergen Reactions  . Lisinopril Other (See Comments)    Facial Swelling Facial Swelling  . Sulfur Other (See Comments)    Urinated blood    History reviewed. No pertinent surgical history.  Social  History  Substance Use Topics  . Smoking status: Never Smoker  . Smokeless tobacco: Never Used  . Alcohol use No    Family History  Problem Relation Age of Onset  . Diabetes Father   . Heart disease Father     Medication list has been reviewed and updated.  Physical Examination: BP 122/82   Pulse 68   Ht 6' (1.829 m)   Wt 191 lb (86.6 kg)   SpO2 97%   BMI 25.90 kg/m   Physical Exam  Constitutional: He appears well-developed and well-nourished.  Cardiovascular: Normal rate, regular rhythm and normal heart sounds.   Pulmonary/Chest: Effort normal and breath sounds normal.  Musculoskeletal:  Strength intact except R foot drop  Neurological: He is alert.  Skin: Skin is warm and dry.  Psychiatric: He has a normal mood and affect. His behavior is normal.  Nursing note and vitals reviewed.   Assessment and Plan:  1. Right lumbar radiculopathy Refill Naprosyn and prn tramadol, f/u with NSGY next week given persistent sxs  2. Right foot drop Unimproved  3. Essential hypertension Well controlled on metoprolol/amlodipine, consider change to daily metoprolol next refill  4. Overweight (BMI 25.0-29.9) Weight down 8#, exercise/weight loss discussed  Return in about 6 months (around 11/11/2017).  Satira Anis. [facility]  05/11/2017

## 2017-05-18 DIAGNOSIS — M5136 Other intervertebral disc degeneration, lumbar region: Secondary | ICD-10-CM | POA: Diagnosis not present

## 2017-05-18 DIAGNOSIS — M21371 Foot drop, right foot: Secondary | ICD-10-CM | POA: Diagnosis not present

## 2017-05-24 ENCOUNTER — Telehealth: Payer: Self-pay | Admitting: Family Medicine

## 2017-05-24 ENCOUNTER — Other Ambulatory Visit: Payer: Self-pay | Admitting: Family Medicine

## 2017-05-24 MED ORDER — TRAMADOL HCL 50 MG PO TABS: 50.0000 mg | ORAL_TABLET | Freq: Four times a day (QID) | ORAL | 0 refills | Status: DC | PRN

## 2017-05-24 NOTE — Telephone Encounter (Signed)
Called and spoke to pt. He states he did see Neurosurgery last week. After the surgery he needs was explained to him, he said it seemed "too complicated" for him and wanted him to try physical therapy for several weeks to a month and then go from there. He stated this is why he is requesting refill for this month for medication. Please Advise.

## 2017-05-24 NOTE — Telephone Encounter (Signed)
Patient requesting refills on medication. See above note.

## 2017-05-24 NOTE — Telephone Encounter (Signed)
Did patient see neurosurgery last week as scheduled? What is the plan?

## 2017-05-24 NOTE — Telephone Encounter (Signed)
Cannot call in, will leave rx for additional #30 only at front desk. Should also be taking ibuprofen or Aleve twice daily for pain.

## 2017-05-24 NOTE — Telephone Encounter (Signed)
PATIENT CALLED REQUESTING REFILLS ON TRAMADOL (ULTRAM) 50 MG tablet  [facility] - [place] (N),  - [place] DEA #:     -PLEASE ADVISE

## 2017-05-25 DIAGNOSIS — M5416 Radiculopathy, lumbar region: Secondary | ICD-10-CM | POA: Diagnosis not present

## 2017-05-25 DIAGNOSIS — M545 Low back pain: Secondary | ICD-10-CM | POA: Diagnosis not present

## 2017-05-25 NOTE — Telephone Encounter (Signed)
Spoke to pt. Informed faxed to [facility] in [place] and Dr. Vicente Masson sent 30 days for him. He verbalized understanding.

## 2017-05-29 DIAGNOSIS — M545 Low back pain: Secondary | ICD-10-CM | POA: Diagnosis not present

## 2017-05-29 DIAGNOSIS — M5416 Radiculopathy, lumbar region: Secondary | ICD-10-CM | POA: Diagnosis not present

## 2017-06-05 DIAGNOSIS — M545 Low back pain: Secondary | ICD-10-CM | POA: Diagnosis not present

## 2017-06-05 DIAGNOSIS — M5416 Radiculopathy, lumbar region: Secondary | ICD-10-CM | POA: Diagnosis not present

## 2017-07-05 ENCOUNTER — Telehealth: Payer: Self-pay

## 2017-07-05 NOTE — Telephone Encounter (Signed)
Patient got a bill from [facility]. He said it is because we never sent Referral and he has been seen already and completed PT. I asked if we sent him and he said yes.  I advised Lexine Baton and we saw that we referred him back to [facility]  As he was seen in past there. He knew we referred him and yet they said he refused all [facility] PT and made his own appt at [facility].  Will call and see if they can do a back dated referral or not and get back to patient,

## 2017-07-06 NOTE — Telephone Encounter (Signed)
After speaking to Alex Hughes at [facility] I explained to patient that because there is NO doctors at [facility] they never require Referrals or Auth. Patient will take bill there.

## 2017-08-09 DIAGNOSIS — H524 Presbyopia: Secondary | ICD-10-CM | POA: Diagnosis not present

## 2018-06-19 ENCOUNTER — Ambulatory Visit (INDEPENDENT_AMBULATORY_CARE_PROVIDER_SITE_OTHER): Payer: PPO | Admitting: Internal Medicine

## 2018-06-19 ENCOUNTER — Encounter: Payer: Self-pay | Admitting: Internal Medicine

## 2018-06-19 VITALS — BP 136/80 | HR 63 | Ht 72.0 in | Wt 188.0 lb

## 2018-06-19 DIAGNOSIS — M5416 Radiculopathy, lumbar region: Secondary | ICD-10-CM

## 2018-06-19 DIAGNOSIS — E663 Overweight: Secondary | ICD-10-CM | POA: Diagnosis not present

## 2018-06-19 DIAGNOSIS — I10 Essential (primary) hypertension: Secondary | ICD-10-CM

## 2018-06-19 DIAGNOSIS — Z23 Encounter for immunization: Secondary | ICD-10-CM | POA: Diagnosis not present

## 2018-06-19 MED ORDER — AMLODIPINE BESYLATE 10 MG PO TABS: 10.0000 mg | ORAL_TABLET | Freq: Every day | ORAL | 1 refills | Status: DC

## 2018-06-19 NOTE — Progress Notes (Signed)
Date:  06/19/2018   Name:  Alex Hughes   DOB:  12/31/1945   MRN:  [id number]   Chief Complaint: Hypertension (Needs refill on Metoprolol and amlodipine. ) and Immunizations (high dose flu shot.) Hypertension  This is a chronic problem. The problem is controlled. Pertinent negatives include no chest pain, headaches or shortness of breath. Past treatments include calcium channel blockers and beta blockers. The current treatment provides significant improvement.   History of Lumbar radiculopathy and right foot drop - completed course of prednisone and PTx last year.  Seen by Dr. Cari Caraway, neurosurgery, who felt that pt was improving and did not recommend surgery at that time.  He still has mild left weakness but he can manage and has no pain.   Review of Systems  Constitutional: Negative for chills, fatigue and unexpected weight change.  Respiratory: Negative for choking and shortness of breath.   Cardiovascular: Negative for chest pain and leg swelling.  Gastrointestinal: Negative for abdominal pain.  Musculoskeletal: Negative for arthralgias.  Skin: Negative for rash.  Neurological: Negative for dizziness and headaches.  Hematological: Negative for adenopathy.  Psychiatric/Behavioral: Negative for sleep disturbance.    Patient Active Problem List   Diagnosis Date Noted  . Right foot drop 05/11/2017  . Right lumbar radiculopathy 11/30/2016  . Tinnitus 11/30/2016  . Hypertension 11/30/2016  . Overweight (BMI 25.0-29.9) 11/30/2016    Allergies  Allergen Reactions  . Lisinopril Swelling    Facial Swelling   . Sulfur Other (See Comments)    Urinated blood    History reviewed. No pertinent surgical history.  Social History   Tobacco Use  . Smoking status: Never Smoker  . Smokeless tobacco: Never Used  Substance Use Topics  . Alcohol use: No  . Drug use: No     Medication list has been reviewed and updated.  Current Meds  Medication Sig  . ibuprofen  (ADVIL,MOTRIN) 800 MG tablet Take 1 tablet (800 mg total) by mouth every 8 (eight) hours as needed.  . metoprolol tartrate (LOPRESSOR) 25 MG tablet Take 25 mg by mouth daily.   . [DISCONTINUED] amLODipine (NORVASC) 10 MG tablet Take 10 mg by mouth daily.     PHQ 2/9 Scores 06/19/2018 04/12/2017  PHQ - 2 Score 0 0    Physical Exam  Constitutional: He is oriented to person, place, and time. He appears well-developed. No distress.  HENT:  Head: Normocephalic and atraumatic.  Neck: Carotid bruit is not present.  Cardiovascular: Normal rate, regular rhythm and normal heart sounds.  Pulmonary/Chest: Effort normal and breath sounds normal. No respiratory distress.  Musculoskeletal: Normal range of motion.  Neurological: He is alert and oriented to person, place, and time.  Skin: Skin is warm and dry. No rash noted.  Psychiatric: He has a normal mood and affect. His behavior is normal. Thought content normal.  Nursing note and vitals reviewed.   BP 136/80 (BP Location: Right Arm, Patient Position: Sitting, Cuff Size: Normal)   Pulse 63   Ht 6' (1.829 m)   Wt 188 lb (85.3 kg)   SpO2 97%   BMI 25.50 kg/m   Assessment and Plan: 1. Essential hypertension Controlled Also on metoprolol from the [facility] Will request labs today - amLODipine (NORVASC) 10 MG tablet; Take 1 tablet (10 mg total) by mouth daily.  Dispense: 90 tablet; Refill: 1 - CBC with Differential/Platelet - Comprehensive metabolic panel - TSH  2. Overweight (BMI 25.0-29.9) Weight is slowly decreasing - continue healthy diet  3. Right lumbar radiculopathy Much improved without ongoing medications  4. Need for influenza vaccination - Flu vaccine HIGH DOSE PF (Fluzone High dose)   Meds ordered this encounter  Medications  . amLODipine (NORVASC) 10 MG tablet    Sig: Take 1 tablet (10 mg total) by mouth daily.    Dispense:  90 tablet    Refill:  1    Partially dictated using Editor, commissioning. Any errors are  unintentional.  Halina Maidens, MD [facility]  06/19/2018

## 2018-06-20 ENCOUNTER — Encounter: Payer: Self-pay | Admitting: Internal Medicine

## 2018-06-20 LAB — CBC WITH DIFFERENTIAL/PLATELET
BASOS: 1 %
Basophils Absolute: 0.1 10*3/uL (ref 0.0–0.2)
EOS (ABSOLUTE): 0.1 10*3/uL (ref 0.0–0.4)
Eos: 1 %
Hematocrit: 47.6 % (ref 37.5–51.0)
Hemoglobin: 16 g/dL (ref 13.0–17.7)
IMMATURE GRANS (ABS): 0 10*3/uL (ref 0.0–0.1)
IMMATURE GRANULOCYTES: 0 %
Lymphocytes Absolute: 1.2 10*3/uL (ref 0.7–3.1)
Lymphs: 17 %
MCH: 27.9 pg (ref 26.6–33.0)
MCHC: 33.6 g/dL (ref 31.5–35.7)
MCV: 83 fL (ref 79–97)
Monocytes Absolute: 0.5 10*3/uL (ref 0.1–0.9)
Monocytes: 7 %
NEUTROS ABS: 5.5 10*3/uL (ref 1.4–7.0)
NEUTROS PCT: 74 %
PLATELETS: 261 10*3/uL (ref 150–450)
RBC: 5.73 x10E6/uL (ref 4.14–5.80)
RDW: 13.1 % (ref 12.3–15.4)
WBC: 7.4 10*3/uL (ref 3.4–10.8)

## 2018-06-20 LAB — COMPREHENSIVE METABOLIC PANEL
ALT: 9 IU/L (ref 0–44)
AST: 16 IU/L (ref 0–40)
Albumin/Globulin Ratio: 2.4 — ABNORMAL HIGH (ref 1.2–2.2)
Albumin: 5 g/dL — ABNORMAL HIGH (ref 3.6–4.8)
Alkaline Phosphatase: 99 IU/L (ref 39–117)
BUN/Creatinine Ratio: 12 (ref 10–24)
BUN: 14 mg/dL (ref 8–27)
Bilirubin Total: 0.5 mg/dL (ref 0.0–1.2)
CO2: 23 mmol/L (ref 20–29)
CREATININE: 1.21 mg/dL (ref 0.76–1.27)
Calcium: 10.5 mg/dL — ABNORMAL HIGH (ref 8.6–10.2)
Chloride: 102 mmol/L (ref 96–106)
GFR calc Af Amer: 70 mL/min/{1.73_m2} (ref >59)
GFR, EST NON AFRICAN AMERICAN: 61 mL/min/{1.73_m2} (ref >59)
GLUCOSE: 88 mg/dL (ref 65–99)
Globulin, Total: 2.1 g/dL (ref 1.5–4.5)
Potassium: 4.7 mmol/L (ref 3.5–5.2)
Sodium: 142 mmol/L (ref 134–144)
Total Protein: 7.1 g/dL (ref 6.0–8.5)

## 2018-06-20 LAB — TSH: TSH: 1.71 u[IU]/mL (ref 0.450–4.500)

## 2018-12-04 ENCOUNTER — Other Ambulatory Visit: Payer: Self-pay | Admitting: Internal Medicine

## 2018-12-04 DIAGNOSIS — I10 Essential (primary) hypertension: Secondary | ICD-10-CM

## 2018-12-07 ENCOUNTER — Telehealth: Payer: Self-pay | Admitting: Internal Medicine

## 2018-12-07 NOTE — Telephone Encounter (Signed)
Called to schedule Medicare Annual Wellness Visit with the Nurse Health Advisor. No answer at Home number and Mobile number.  No machine at Home number.  Left message on Mobile voicemail to call Lattie Haw at [phone].  If patient returns call, please schedule AWV-I.  Patient is due NOW.    Thank you! For any questions please contact: Janace Hoard at [phone] or Skype [email or web address]

## 2018-12-07 NOTE — Telephone Encounter (Signed)
Patient returned my call about scheduling Medicare Annual Wellness Visit.  Patient wants to discuss this with Dr. Army Melia on 12/19/2018 2:00 PM visit.  I explained the benefit of this visit, but patient still wants to talk to Dr. Army Melia first.  Janace Hoard, Care Guide.

## 2018-12-19 ENCOUNTER — Other Ambulatory Visit: Payer: Self-pay

## 2018-12-19 ENCOUNTER — Ambulatory Visit (INDEPENDENT_AMBULATORY_CARE_PROVIDER_SITE_OTHER): Payer: PPO | Admitting: Internal Medicine

## 2018-12-19 ENCOUNTER — Encounter: Payer: Self-pay | Admitting: Internal Medicine

## 2018-12-19 VITALS — BP 132/81 | HR 62 | Ht 72.0 in | Wt 188.0 lb

## 2018-12-19 DIAGNOSIS — Z1159 Encounter for screening for other viral diseases: Secondary | ICD-10-CM

## 2018-12-19 DIAGNOSIS — I10 Essential (primary) hypertension: Secondary | ICD-10-CM | POA: Diagnosis not present

## 2018-12-19 NOTE — Progress Notes (Signed)
Date:  12/19/2018   Name:  Alex Hughes   DOB:  [birth year]   MRN:  [id number]   Chief Complaint: Hypertension (6 month follow up.)  Hypertension  This is a chronic problem. The problem is controlled. Pertinent negatives include no chest pain, headaches, palpitations or shortness of breath. Past treatments include calcium channel blockers and beta blockers. The current treatment provides significant improvement.   Hypercalcemia - seen on labs in September.  He has not had any additional labs at the [place] since then.  He denies a hx of hypercalcemia.  He feels well with no muscle spasms or weakness, no cough or SOB, no weight loss.  Pt wants to come here for all his routine needs and only go to the [facility] for specialist issues. He does not know if he had Hep C or Pneumonia vaccines.  Review of Systems  Constitutional: Negative for chills, fatigue, fever and unexpected weight change.  HENT: Negative for trouble swallowing.   Respiratory: Negative for cough, chest tightness, shortness of breath and wheezing.   Cardiovascular: Negative for chest pain, palpitations and leg swelling.  Gastrointestinal: Negative for abdominal pain and blood in stool.  Endocrine: Negative for polydipsia and polyuria.  Musculoskeletal: Negative for arthralgias and gait problem.  Neurological: Negative for dizziness, syncope and headaches.  Psychiatric/Behavioral: Negative for dysphoric mood and sleep disturbance.    Patient Active Problem List   Diagnosis Date Noted  . Hypercalcemia 06/20/2018  . Right foot drop 05/11/2017  . Right lumbar radiculopathy 11/30/2016  . Tinnitus 11/30/2016  . Hypertension 11/30/2016  . Overweight (BMI 25.0-29.9) 11/30/2016    Allergies  Allergen Reactions  . Lisinopril Swelling    Facial Swelling   . Sulfur Other (See Comments)    Urinated blood    History reviewed. No pertinent surgical history.  Social History   Tobacco Use  . Smoking status: Never Smoker  .  Smokeless tobacco: Never Used  Substance Use Topics  . Alcohol use: No  . Drug use: No     Medication list has been reviewed and updated.  Current Meds  Medication Sig  . amLODipine (NORVASC) 10 MG tablet TAKE 1 TABLET(10 MG) BY MOUTH DAILY  . ibuprofen (ADVIL,MOTRIN) 800 MG tablet Take 1 tablet (800 mg total) by mouth every 8 (eight) hours as needed.  . metoprolol tartrate (LOPRESSOR) 25 MG tablet Take 25 mg by mouth daily.     PHQ 2/9 Scores 12/19/2018 06/19/2018 04/12/2017  PHQ - 2 Score 0 0 0   Wt Readings from Last 3 Encounters:  12/19/18 188 lb (85.3 kg)  06/19/18 188 lb (85.3 kg)  05/11/17 191 lb (86.6 kg)    Physical Exam Vitals signs and nursing note reviewed.  Constitutional:      General: He is not in acute distress.    Appearance: He is well-developed.  HENT:     Head: Normocephalic and atraumatic.  Eyes:     Pupils: Pupils are equal, round, and reactive to light.  Neck:     Musculoskeletal: Normal range of motion and neck supple.     Vascular: No carotid bruit.  Cardiovascular:     Rate and Rhythm: Normal rate and regular rhythm.     Pulses:          Dorsalis pedis pulses are 1+ on the right side and 1+ on the left side.       Posterior tibial pulses are 1+ on the right side and 1+ on the left  side.  Pulmonary:     Effort: Pulmonary effort is normal. No respiratory distress.     Breath sounds: Normal breath sounds.  Musculoskeletal: Normal range of motion.     Right lower leg: No edema.     Left lower leg: No edema.  Skin:    General: Skin is warm and dry.     Findings: No rash.  Neurological:     Mental Status: He is alert and oriented to person, place, and time.  Psychiatric:        Behavior: Behavior normal.        Thought Content: Thought content normal.     BP 132/81   Pulse 62   Ht 6' (1.829 m)   Wt 188 lb (85.3 kg)   SpO2 98%   BMI 25.50 kg/m   Assessment and Plan: 1. Essential hypertension Controlled, continue current  medications  2. Hypercalcemia Recheck labs today - Comprehensive metabolic panel  3. Need for hepatitis C screening test - Hepatitis C antibody   Partially dictated using Editor, commissioning. Any errors are unintentional.  Halina Maidens, MD [facility]  12/19/2018

## 2018-12-20 LAB — COMPREHENSIVE METABOLIC PANEL
A/G RATIO: 2.2 (ref 1.2–2.2)
ALBUMIN: 5.1 g/dL — AB (ref 3.8–4.8)
ALT: 11 IU/L (ref 0–44)
AST: 17 IU/L (ref 0–40)
Alkaline Phosphatase: 115 IU/L (ref 39–117)
BUN/Creatinine Ratio: 16 (ref 10–24)
BUN: 16 mg/dL (ref 8–27)
Bilirubin Total: 0.5 mg/dL (ref 0.0–1.2)
CALCIUM: 10.5 mg/dL — AB (ref 8.6–10.2)
CO2: 23 mmol/L (ref 20–29)
Chloride: 105 mmol/L (ref 96–106)
Creatinine, Ser: 1.03 mg/dL (ref 0.76–1.27)
GFR, EST AFRICAN AMERICAN: 85 mL/min/{1.73_m2} (ref >59)
GFR, EST NON AFRICAN AMERICAN: 73 mL/min/{1.73_m2} (ref >59)
GLOBULIN, TOTAL: 2.3 g/dL (ref 1.5–4.5)
Glucose: 87 mg/dL (ref 65–99)
POTASSIUM: 4.5 mmol/L (ref 3.5–5.2)
SODIUM: 142 mmol/L (ref 134–144)
TOTAL PROTEIN: 7.4 g/dL (ref 6.0–8.5)

## 2018-12-20 LAB — HEPATITIS C ANTIBODY: Hep C Virus Ab: <0.1 s/co ratio (ref 0.0–0.9)

## 2018-12-22 ENCOUNTER — Other Ambulatory Visit: Payer: Self-pay | Admitting: Internal Medicine

## 2018-12-24 ENCOUNTER — Other Ambulatory Visit: Payer: Self-pay

## 2018-12-26 ENCOUNTER — Other Ambulatory Visit: Payer: Self-pay

## 2018-12-27 LAB — PTH, INTACT AND CALCIUM
CALCIUM: 10.5 mg/dL — AB (ref 8.6–10.2)
PTH: 68 pg/mL — AB (ref 15–65)

## 2019-01-31 ENCOUNTER — Other Ambulatory Visit: Payer: Self-pay | Admitting: Internal Medicine

## 2019-01-31 DIAGNOSIS — I10 Essential (primary) hypertension: Secondary | ICD-10-CM

## 2019-07-30 ENCOUNTER — Other Ambulatory Visit: Payer: Self-pay | Admitting: Internal Medicine

## 2019-07-30 DIAGNOSIS — I10 Essential (primary) hypertension: Secondary | ICD-10-CM

## 2019-09-06 ENCOUNTER — Encounter: Payer: PPO | Admitting: Internal Medicine

## 2019-10-08 ENCOUNTER — Ambulatory Visit: Payer: PPO | Admitting: Internal Medicine

## 2019-10-23 ENCOUNTER — Encounter: Payer: Self-pay | Admitting: Internal Medicine

## 2019-10-23 ENCOUNTER — Other Ambulatory Visit: Payer: Self-pay

## 2019-10-23 ENCOUNTER — Ambulatory Visit (INDEPENDENT_AMBULATORY_CARE_PROVIDER_SITE_OTHER): Payer: PPO | Admitting: Internal Medicine

## 2019-10-23 VITALS — BP 132/80 | HR 57 | Ht 72.0 in | Wt 179.0 lb

## 2019-10-23 DIAGNOSIS — I10 Essential (primary) hypertension: Secondary | ICD-10-CM

## 2019-10-23 NOTE — Progress Notes (Signed)
Date:  10/23/2019   Name:  Alex Hughes   DOB:  August 25, 1946   MRN:  [id number]   Chief Complaint: Hypertension and Elevated calcium  Hypertension This is a chronic problem. The problem is controlled (bp at home 130/80). Pertinent negatives include no chest pain, headaches, palpitations or shortness of breath. Past treatments include beta blockers and calcium channel blockers. The current treatment provides significant improvement. There are no compliance problems.     Lab Results  Component Value Date   CREATININE 1.03 12/19/2018   BUN 16 12/19/2018   NA 142 12/19/2018   K 4.5 12/19/2018   CL 105 12/19/2018   CO2 23 12/19/2018   No results found for: CHOL, HDL, LDLCALC, LDLDIRECT, TRIG, CHOLHDL Lab Results  Component Value Date   TSH 1.710 06/19/2018   Lab Results  Component Value Date   PTH 68 (H) 12/26/2018   PTH Comment 12/26/2018   CALCIUM 10.5 (H) 12/26/2018     Review of Systems  Constitutional: Negative for appetite change, fatigue and unexpected weight change.  Eyes: Negative for visual disturbance.  Respiratory: Negative for cough, shortness of breath and wheezing.   Cardiovascular: Negative for chest pain, palpitations and leg swelling.  Gastrointestinal: Negative for abdominal pain and blood in stool.  Endocrine: Negative for polyuria.  Genitourinary: Negative for dysuria and hematuria.  Skin: Negative for color change and rash.  Neurological: Negative for dizziness, tremors, numbness and headaches.  Psychiatric/Behavioral: Negative for dysphoric mood.    Patient Active Problem List   Diagnosis Date Noted  . Hypercalcemia 06/20/2018  . Right foot drop 05/11/2017  . Right lumbar radiculopathy 11/30/2016  . Tinnitus 11/30/2016  . Hypertension 11/30/2016  . Overweight (BMI 25.0-29.9) 11/30/2016    Allergies  Allergen Reactions  . Lisinopril Swelling    Facial Swelling   . Sulfur Other (See Comments)    Urinated blood    History reviewed. No  pertinent surgical history.  Social History   Tobacco Use  . Smoking status: Never Smoker  . Smokeless tobacco: Never Used  Substance Use Topics  . Alcohol use: No  . Drug use: No     Medication list has been reviewed and updated.  Current Meds  Medication Sig  . amLODipine (NORVASC) 10 MG tablet TAKE 1 TABLET BY MOUTH DAILY  . ibuprofen (ADVIL,MOTRIN) 800 MG tablet Take 1 tablet (800 mg total) by mouth every 8 (eight) hours as needed.  . metoprolol tartrate (LOPRESSOR) 25 MG tablet Take 25 mg by mouth daily.     PHQ 2/9 Scores 10/23/2019 12/19/2018 06/19/2018 04/12/2017  PHQ - 2 Score 0 0 0 0    BP Readings from Last 3 Encounters:  10/23/19 132/80  12/19/18 132/81  06/19/18 136/80    Physical Exam Vitals and nursing note reviewed.  Constitutional:      General: He is not in acute distress.    Appearance: He is well-developed.  HENT:     Head: Normocephalic and atraumatic.  Neck:     Vascular: No carotid bruit.  Cardiovascular:     Rate and Rhythm: Normal rate and regular rhythm.     Pulses: Normal pulses.  Pulmonary:     Effort: Pulmonary effort is normal. No respiratory distress.     Breath sounds: No wheezing or rhonchi.  Musculoskeletal:        General: Normal range of motion.     Cervical back: Normal range of motion.     Right lower leg: No edema.  Left lower leg: No edema.  Lymphadenopathy:     Cervical: No cervical adenopathy.  Skin:    General: Skin is warm and dry.     Capillary Refill: Capillary refill takes less than 2 seconds.     Findings: No rash.  Neurological:     General: No focal deficit present.     Mental Status: He is alert and oriented to person, place, and time.  Psychiatric:        Behavior: Behavior normal.        Thought Content: Thought content normal.     Wt Readings from Last 3 Encounters:  10/23/19 179 lb (81.2 kg)  12/19/18 188 lb (85.3 kg)  06/19/18 188 lb (85.3 kg)    BP 132/80   Pulse (!) 57   Ht 6' (1.829 m)    Wt 179 lb (81.2 kg)   SpO2 97%   BMI 24.28 kg/m   Assessment and Plan: 1. Essential hypertension Clinically stable exam with well controlled BP.   Tolerating medications, amlodipine and metoprolol, without side effects at this time. Pt to continue current regimen and low sodium diet; benefits of regular exercise as able discussed.   2. Hypercalcemia Will recheck labs and advise Suspect primary hyperparathyroidism   Partially dictated using Dragon software. Any errors are unintentional.  Halina Maidens, MD [facility]  10/23/2019

## 2019-10-24 ENCOUNTER — Other Ambulatory Visit: Payer: Self-pay | Admitting: Internal Medicine

## 2019-10-24 LAB — BASIC METABOLIC PANEL
BUN/Creatinine Ratio: 9 — ABNORMAL LOW (ref 10–24)
BUN: 12 mg/dL (ref 8–27)
CO2: 24 mmol/L (ref 20–29)
Calcium: 11 mg/dL — ABNORMAL HIGH (ref 8.6–10.2)
Chloride: 102 mmol/L (ref 96–106)
Creatinine, Ser: 1.28 mg/dL — ABNORMAL HIGH (ref 0.76–1.27)
GFR calc Af Amer: 65 mL/min/{1.73_m2} (ref >59)
GFR calc non Af Amer: 56 mL/min/{1.73_m2} — ABNORMAL LOW (ref >59)
Glucose: 96 mg/dL (ref 65–99)
Potassium: 5 mmol/L (ref 3.5–5.2)
Sodium: 141 mmol/L (ref 134–144)

## 2019-10-24 LAB — PTH, INTACT AND CALCIUM: PTH: 59 pg/mL (ref 15–65)

## 2019-10-24 NOTE — Progress Notes (Signed)
Patient informed. Agrees to endocrinology referral for [facility].

## 2019-10-30 ENCOUNTER — Telehealth: Payer: Self-pay

## 2019-10-30 NOTE — Telephone Encounter (Signed)
Called patient to discuss scheduling AWV. Pt states he has upcoming appointment with endocrinology and wants to get that taken care of and then call me back to schedule. Pt may reach me directly at [phone]. [id number] AWV-I eligible per [employer] as of 08/17/14.

## 2019-12-11 DIAGNOSIS — E213 Hyperparathyroidism, unspecified: Secondary | ICD-10-CM | POA: Diagnosis not present

## 2019-12-16 DIAGNOSIS — E213 Hyperparathyroidism, unspecified: Secondary | ICD-10-CM | POA: Diagnosis not present

## 2020-01-23 ENCOUNTER — Other Ambulatory Visit: Payer: Self-pay | Admitting: Internal Medicine

## 2020-01-23 DIAGNOSIS — I10 Essential (primary) hypertension: Secondary | ICD-10-CM

## 2020-01-27 ENCOUNTER — Telehealth: Payer: Self-pay

## 2020-01-27 NOTE — Telephone Encounter (Signed)
It sounds like eye fatigue or it could be a neurological disorder.  He needs to see his eye doctor first who can determine the next step in testing, if any.  It is not related to his contacts unless his eyes are dry - which the eye doctor can also determine.

## 2020-01-27 NOTE — Telephone Encounter (Signed)
Patient called and left a VM stating he is having some issues.  Called him back- pt said he is noticing issues with focus in his vision.   Michela Pitcher its been going on the last few days. He gets off around 1 PM and he has to close his right eye to be able to focus his vision- he does this the entire drive home. He goes home and eats his dinner, takes a nap and then the focus issue is gone. I told him he should contact his eye doctor ( he wears contacts ) and he said he doesn't think its contact related. He said he does not sleep well- and wakes up every morning at 2:30 AM. He feels better everyday after eating and then taking a nap.  Please advise.

## 2020-01-27 NOTE — Telephone Encounter (Signed)
Patient informed. Told to call eye doctor first. If the eye doctor says it is not his eyes causing the problem- told him to call us and schedule a visit to discuss this.  He verbalized understanding.  CM

## 2020-03-30 ENCOUNTER — Telehealth: Payer: Self-pay | Admitting: Internal Medicine

## 2020-03-30 NOTE — Telephone Encounter (Signed)
Left message for patient to call back and schedule Medicare Annual Wellness Visit (AWV) either virtually/audio only or in office. Whichever the patients preference is.  No history of AWV; please schedule at anytime with [facility] Health Advisor.  This should be a 40 minute visit

## 2020-04-21 ENCOUNTER — Encounter: Payer: Self-pay | Admitting: Internal Medicine

## 2020-04-21 ENCOUNTER — Other Ambulatory Visit: Payer: Self-pay

## 2020-04-21 ENCOUNTER — Ambulatory Visit (INDEPENDENT_AMBULATORY_CARE_PROVIDER_SITE_OTHER): Payer: PPO | Admitting: Internal Medicine

## 2020-04-21 VITALS — BP 110/84 | HR 63 | Temp 98.2°F | Ht 72.0 in | Wt 179.0 lb

## 2020-04-21 DIAGNOSIS — M21371 Foot drop, right foot: Secondary | ICD-10-CM | POA: Diagnosis not present

## 2020-04-21 DIAGNOSIS — I10 Essential (primary) hypertension: Secondary | ICD-10-CM | POA: Diagnosis not present

## 2020-04-21 DIAGNOSIS — E210 Primary hyperparathyroidism: Secondary | ICD-10-CM | POA: Insufficient documentation

## 2020-04-21 DIAGNOSIS — M5416 Radiculopathy, lumbar region: Secondary | ICD-10-CM

## 2020-04-21 DIAGNOSIS — E559 Vitamin D deficiency, unspecified: Secondary | ICD-10-CM

## 2020-04-21 MED ORDER — AMLODIPINE BESYLATE 10 MG PO TABS: 10.0000 mg | ORAL_TABLET | Freq: Every day | ORAL | 1 refills | Status: DC

## 2020-04-21 MED ORDER — METOPROLOL TARTRATE 25 MG PO TABS: 25.0000 mg | ORAL_TABLET | Freq: Every day | ORAL | 1 refills | Status: DC

## 2020-04-21 MED ORDER — VITAMIN D 50 MCG (2000 UT) PO TABS: 2000.0000 [IU] | ORAL_TABLET | Freq: Every day | ORAL | 1 refills | Status: DC

## 2020-04-21 NOTE — Progress Notes (Signed)
Date:  04/21/2020   Name:  Alex Hughes   DOB:  April 03, 1946   MRN:  [id number]   Chief Complaint: Hypertension (follow up ), Fall (falls about once a week/ off balance no symptoms before fall), and Colon Cancer Screening (wants cologuard)  Hypertension This is a chronic problem. The problem is controlled. Pertinent negatives include no chest pain, headaches, palpitations or shortness of breath. There are no associated agents to hypertension. Past treatments include beta blockers and calcium channel blockers. The current treatment provides significant improvement. Identifiable causes of hypertension include hyperparathyroidism.  Fall The patient is experiencing no pain. Exacerbated by: has right foot drop and most falls are on uneven surfaces in the yard, etc.  No injuries so far. Daughter is concerned. Pertinent negatives include no fever or headaches.  Hyperparathyroidism - sent to Endo for evaluation.  Was told that labs were stable and to follow up on one year.  Vitamin D was low and was to start 2000 IU daily which he has not done.   Lab Results  Component Value Date   CREATININE 1.28 (H) 10/23/2019   BUN 12 10/23/2019   NA 141 10/23/2019   K 5.0 10/23/2019   CL 102 10/23/2019   CO2 24 10/23/2019   No results found for: CHOL, HDL, LDLCALC, LDLDIRECT, TRIG, CHOLHDL Lab Results  Component Value Date   TSH 1.710 06/19/2018   No results found for: HGBA1C Lab Results  Component Value Date   WBC 7.4 06/19/2018   HGB 16.0 06/19/2018   HCT 47.6 06/19/2018   MCV 83 06/19/2018   PLT 261 06/19/2018   Lab Results  Component Value Date   ALT 11 12/19/2018   AST 17 12/19/2018   ALKPHOS 115 12/19/2018   BILITOT 0.5 12/19/2018     Review of Systems  Constitutional: Negative for chills, diaphoresis and fever.  Respiratory: Negative for chest tightness and shortness of breath.   Cardiovascular: Negative for chest pain, palpitations and leg swelling.  Gastrointestinal: Negative  for constipation and diarrhea.  Musculoskeletal: Positive for gait problem. Negative for arthralgias.  Skin: Negative for color change and rash.  Neurological: Positive for weakness (right foot drop). Negative for dizziness, light-headedness and headaches.  Psychiatric/Behavioral: Negative for dysphoric mood and sleep disturbance. The patient is not nervous/anxious.     Patient Active Problem List   Diagnosis Date Noted  . Primary hyperparathyroidism ([place]) 04/21/2020  . Vitamin D deficiency 04/21/2020  . Hypercalcemia 06/20/2018  . Right foot drop 05/11/2017  . Right lumbar radiculopathy 11/30/2016  . Tinnitus 11/30/2016  . Hypertension 11/30/2016  . Overweight (BMI 25.0-29.9) 11/30/2016    Allergies  Allergen Reactions  . Lisinopril Swelling    Facial Swelling   . Sulfur Other (See Comments)    Urinated blood    History reviewed. No pertinent surgical history.  Social History   Tobacco Use  . Smoking status: Never Smoker  . Smokeless tobacco: Never Used  Substance Use Topics  . Alcohol use: No  . Drug use: No     Medication list has been reviewed and updated.  Current Meds  Medication Sig  . amLODipine (NORVASC) 10 MG tablet TAKE 1 TABLET BY MOUTH DAILY  . metoprolol tartrate (LOPRESSOR) 25 MG tablet Take 25 mg by mouth daily.     PHQ 2/9 Scores 04/21/2020 10/23/2019 12/19/2018 06/19/2018  PHQ - 2 Score 0 0 0 0  PHQ- 9 Score 2 - - -    GAD 7 : Generalized Anxiety  Score 04/21/2020  Nervous, Anxious, on Edge 0  Control/stop worrying 0  Worry too much - different things 0  Trouble relaxing 0  Restless 0  Easily annoyed or irritable 0  Afraid - awful might happen 0  Total GAD 7 Score 0  Anxiety Difficulty Not difficult at all    BP Readings from Last 3 Encounters:  04/21/20 110/84  10/23/19 132/80  12/19/18 132/81    Physical Exam Vitals and nursing note reviewed.  Constitutional:      General: He is not in acute distress.    Appearance: Normal  appearance. He is well-developed.  HENT:     Head: Normocephalic and atraumatic.  Cardiovascular:     Rate and Rhythm: Normal rate and regular rhythm.     Pulses: Normal pulses.     Heart sounds: No murmur heard.   Pulmonary:     Effort: Pulmonary effort is normal. No respiratory distress.     Breath sounds: No wheezing or rhonchi.  Musculoskeletal:     Cervical back: Normal range of motion.     Right lower leg: No edema.     Left lower leg: No edema.  Skin:    General: Skin is warm and dry.     Findings: No rash.  Neurological:     Mental Status: He is alert and oriented to person, place, and time.     Motor: Weakness (right foot drop) present.     Gait: Gait abnormal.     Deep Tendon Reflexes:     Reflex Scores:      Bicep reflexes are 1+ on the right side and 1+ on the left side.      Patellar reflexes are 2+ on the right side and 2+ on the left side. Psychiatric:        Attention and Perception: Attention normal.        Mood and Affect: Mood normal.     Wt Readings from Last 3 Encounters:  04/21/20 179 lb (81.2 kg)  10/23/19 179 lb (81.2 kg)  12/19/18 188 lb (85.3 kg)    BP 110/84   Pulse 63   Temp 98.2 F (36.8 C) (Oral)   Ht 6' (1.829 m)   Wt 179 lb (81.2 kg)   SpO2 97%   BMI 24.28 kg/m   Assessment and Plan: 1. Essential hypertension Clinically stable exam with well controlled BP. Tolerating medications without side effects at this time. Pt to continue current regimen and low sodium diet; benefits of regular exercise as able discussed. - amLODipine (NORVASC) 10 MG tablet; Take 1 tablet (10 mg total) by mouth daily.  Dispense: 90 tablet; Refill: 1 - metoprolol tartrate (LOPRESSOR) 25 MG tablet; Take 1 tablet (25 mg total) by mouth daily.  Dispense: 90 tablet; Refill: 1  2. Right lumbar radiculopathy  3. Right foot drop Likely the cause of his falls on uneven surfaces Consider PT referral and evaluation for a posterior splint  4. Vitamin D  deficiency Begin vitamin D supplement as instructed - Cholecalciferol (VITAMIN D) 50 MCG (2000 UT) tablet; Take 1 tablet (2,000 Units total) by mouth daily.  Dispense: 90 tablet; Refill: 1  5. Primary hyperparathyroidism [facility]) Now followed by Endocrinology   Partially dictated using Dragon software. Any errors are unintentional.  Halina Maidens, MD [facility]  04/21/2020

## 2020-04-21 NOTE — Patient Instructions (Signed)
Your falls are probably due to the right foot drop.  I would recommend considering a physical therapy evaluation for strengthening and possible splint.

## 2020-09-18 ENCOUNTER — Telehealth: Payer: Self-pay | Admitting: Internal Medicine

## 2020-09-18 NOTE — Telephone Encounter (Signed)
CALLED PT/CALL COULD NOT BE COMPLETED. Pt due to schedule Medicare Annual Wellness Visit (AWV) either virtually or in office. Whichever the patients preference is.  No history of AWV; please schedule at anytime with [facility] Health Advisor.  This should be a 40 minute visit

## 2020-10-15 ENCOUNTER — Other Ambulatory Visit: Payer: Self-pay | Admitting: Internal Medicine

## 2020-10-15 DIAGNOSIS — I10 Essential (primary) hypertension: Secondary | ICD-10-CM

## 2020-10-30 ENCOUNTER — Ambulatory Visit (INDEPENDENT_AMBULATORY_CARE_PROVIDER_SITE_OTHER): Payer: PPO | Admitting: Internal Medicine

## 2020-10-30 ENCOUNTER — Encounter: Payer: Self-pay | Admitting: Internal Medicine

## 2020-10-30 ENCOUNTER — Other Ambulatory Visit: Payer: Self-pay

## 2020-10-30 VITALS — BP 132/78 | HR 59 | Temp 97.5°F | Ht 72.0 in | Wt 176.0 lb

## 2020-10-30 DIAGNOSIS — I10 Essential (primary) hypertension: Secondary | ICD-10-CM

## 2020-10-30 DIAGNOSIS — E559 Vitamin D deficiency, unspecified: Secondary | ICD-10-CM

## 2020-10-30 DIAGNOSIS — G3184 Mild cognitive impairment, so stated: Secondary | ICD-10-CM

## 2020-10-30 DIAGNOSIS — E210 Primary hyperparathyroidism: Secondary | ICD-10-CM | POA: Diagnosis not present

## 2020-10-30 NOTE — Patient Instructions (Addendum)
Resume walking every day - aim for 20 minutes.  Use a walking stick if needed.  Do puzzles, cross words, play cards or other games daily to exercise your mind.  Take vitamin D 2000 IU every day.

## 2020-10-30 NOTE — Progress Notes (Signed)
Date:  10/30/2020   Name:  Alex Hughes   DOB:  07/05/1946   MRN:  [id number]   Chief Complaint: Hypertension  Hypertension This is a chronic problem. The problem is controlled. Pertinent negatives include no chest pain, headaches, palpitations or shortness of breath. Past treatments include calcium channel blockers and beta blockers. The current treatment provides significant improvement. There are no compliance problems.   Hyperparathyroidism - low vitamin D 13.8.   Seen by Endo and recommended yearly follow up. Memory concerns - he states that his daughter is concerned about his memory.  His wife has not commented.  He continues to work as a Presenter, broadcasting.  He does not exercise, he does not have any hobbies.  He used to play poker but his buddy aged out.  He has not forgotten to pay any bills, he has not gotten lost driving.  Lab Results  Component Value Date   CREATININE 1.28 (H) 10/23/2019   BUN 12 10/23/2019   NA 141 10/23/2019   K 5.0 10/23/2019   CL 102 10/23/2019   CO2 24 10/23/2019   No results found for: CHOL, HDL, LDLCALC, LDLDIRECT, TRIG, CHOLHDL Lab Results  Component Value Date   TSH 1.710 06/19/2018   No results found for: HGBA1C Lab Results  Component Value Date   WBC 7.4 06/19/2018   HGB 16.0 06/19/2018   HCT 47.6 06/19/2018   MCV 83 06/19/2018   PLT 261 06/19/2018   Lab Results  Component Value Date   ALT 11 12/19/2018   AST 17 12/19/2018   ALKPHOS 115 12/19/2018   BILITOT 0.5 12/19/2018     Review of Systems  Constitutional: Negative for appetite change, fatigue and unexpected weight change.  HENT: Negative for trouble swallowing.   Eyes: Negative for visual disturbance.  Respiratory: Negative for cough, shortness of breath and wheezing.   Cardiovascular: Negative for chest pain, palpitations and leg swelling.  Gastrointestinal: Negative for abdominal pain, blood in stool, constipation and diarrhea.  Genitourinary: Negative for dysuria and  hematuria.  Musculoskeletal: Negative for arthralgias.  Skin: Negative for color change and rash.  Neurological: Negative for dizziness, tremors, numbness and headaches.  Psychiatric/Behavioral: Positive for decreased concentration. Negative for dysphoric mood.    Patient Active Problem List   Diagnosis Date Noted  . Primary hyperparathyroidism ([place]) 04/21/2020  . Vitamin D deficiency 04/21/2020  . Hypercalcemia 06/20/2018  . Right foot drop 05/11/2017  . Right lumbar radiculopathy 11/30/2016  . Tinnitus 11/30/2016  . Essential hypertension 11/30/2016  . Overweight (BMI 25.0-29.9) 11/30/2016    Allergies  Allergen Reactions  . Lisinopril Swelling    Facial Swelling   . Sulfur Other (See Comments)    Urinated blood    History reviewed. No pertinent surgical history.  Social History   Tobacco Use  . Smoking status: Never Smoker  . Smokeless tobacco: Never Used  Substance Use Topics  . Alcohol use: No  . Drug use: No     Medication list has been reviewed and updated.  Current Meds  Medication Sig  . amLODipine (NORVASC) 10 MG tablet TAKE 1 TABLET(10 MG) BY MOUTH DAILY  . metoprolol tartrate (LOPRESSOR) 25 MG tablet TAKE 1 TABLET(25 MG) BY MOUTH DAILY    PHQ 2/9 Scores 10/30/2020 04/21/2020 10/23/2019 12/19/2018  PHQ - 2 Score 0 0 0 0  PHQ- 9 Score 0 2 - -    GAD 7 : Generalized Anxiety Score 10/30/2020 04/21/2020  Nervous, Anxious, on Edge 0 0  Control/stop worrying 0 0  Worry too much - different things 0 0  Trouble relaxing 0 0  Restless 0 0  Easily annoyed or irritable 0 0  Afraid - awful might happen 0 0  Total GAD 7 Score 0 0  Anxiety Difficulty - Not difficult at all    BP Readings from Last 3 Encounters:  10/30/20 132/78  04/21/20 110/84  10/23/19 132/80    Physical Exam Vitals and nursing note reviewed.  Constitutional:      General: He is not in acute distress.    Appearance: He is well-developed.  HENT:     Head: Normocephalic and atraumatic.   Cardiovascular:     Rate and Rhythm: Normal rate and regular rhythm.     Pulses: Normal pulses.     Heart sounds: No murmur heard.   Pulmonary:     Effort: Pulmonary effort is normal. No respiratory distress.     Breath sounds: No wheezing or rhonchi.  Musculoskeletal:     Cervical back: Normal range of motion.     Right lower leg: No edema.     Left lower leg: No edema.  Lymphadenopathy:     Cervical: No cervical adenopathy.  Skin:    General: Skin is warm and dry.     Capillary Refill: Capillary refill takes less than 2 seconds.     Findings: No rash.  Neurological:     General: No focal deficit present.     Mental Status: He is alert and oriented to person, place, and time.  Psychiatric:        Attention and Perception: Attention normal.        Mood and Affect: Mood and affect and mood normal.        Speech: Speech normal.        Behavior: Behavior normal.        Thought Content: Thought content does not include suicidal plan.        Cognition and Memory: He exhibits impaired recent memory (by 6CIT test).     Wt Readings from Last 3 Encounters:  10/30/20 176 lb (79.8 kg)  04/21/20 179 lb (81.2 kg)  10/23/19 179 lb (81.2 kg)    BP 132/78   Pulse (!) 59   Temp (!) 97.5 F (36.4 C) (Oral)   Ht 6' (1.829 m)   Wt 176 lb (79.8 kg)   SpO2 97%   BMI 23.87 kg/m   Assessment and Plan: 1. Essential hypertension Clinically stable exam with well controlled BP. Tolerating medications without side effects at this time. Pt to continue current regimen and low sodium diet; benefits of regular exercise as able discussed. - CBC with Differential/Platelet - Comprehensive metabolic panel  2. Primary hyperparathyroidism ([place]) Recheck labs Patient reminded to follow up with Endo - Comprehensive metabolic panel  3. Vitamin D deficiency Pt is not taking the supplement - he is reminded to resume. - VITAMIN D 25 Hydroxy (Vit-D Deficiency, Fractures)  4. Mild cognitive  impairment Patient is encouraged to begin regular exercise such a walking He is also encouraged to engage in mental exercises such as card playing, puzzles, etc. Follow up if worsening   Partially dictated using Dragon software. Any errors are unintentional.  Halina Maidens, MD [facility]  10/30/2020

## 2020-10-31 LAB — CBC WITH DIFFERENTIAL/PLATELET
Basophils Absolute: 0 10*3/uL (ref 0.0–0.2)
Basos: 1 %
EOS (ABSOLUTE): 0 10*3/uL (ref 0.0–0.4)
Eos: 0 %
Hematocrit: 50.8 % (ref 37.5–51.0)
Hemoglobin: 16.7 g/dL (ref 13.0–17.7)
Immature Grans (Abs): 0 10*3/uL (ref 0.0–0.1)
Immature Granulocytes: 0 %
Lymphocytes Absolute: 1.1 10*3/uL (ref 0.7–3.1)
Lymphs: 14 %
MCH: 28 pg (ref 26.6–33.0)
MCHC: 32.9 g/dL (ref 31.5–35.7)
MCV: 85 fL (ref 79–97)
Monocytes Absolute: 0.5 10*3/uL (ref 0.1–0.9)
Monocytes: 6 %
Neutrophils Absolute: 6.1 10*3/uL (ref 1.4–7.0)
Neutrophils: 79 %
Platelets: 251 10*3/uL (ref 150–450)
RBC: 5.96 x10E6/uL — ABNORMAL HIGH (ref 4.14–5.80)
RDW: 12.8 % (ref 11.6–15.4)
WBC: 7.7 10*3/uL (ref 3.4–10.8)

## 2020-10-31 LAB — COMPREHENSIVE METABOLIC PANEL
ALT: 9 IU/L (ref 0–44)
AST: 18 IU/L (ref 0–40)
Albumin/Globulin Ratio: 2.1 (ref 1.2–2.2)
Albumin: 4.9 g/dL — ABNORMAL HIGH (ref 3.7–4.7)
Alkaline Phosphatase: 109 IU/L (ref 44–121)
BUN/Creatinine Ratio: 9 — ABNORMAL LOW (ref 10–24)
BUN: 12 mg/dL (ref 8–27)
Bilirubin Total: 0.7 mg/dL (ref 0.0–1.2)
CO2: 23 mmol/L (ref 20–29)
Calcium: 10.4 mg/dL — ABNORMAL HIGH (ref 8.6–10.2)
Chloride: 105 mmol/L (ref 96–106)
Creatinine, Ser: 1.31 mg/dL — ABNORMAL HIGH (ref 0.76–1.27)
GFR calc Af Amer: 62 mL/min/{1.73_m2} (ref >59)
GFR calc non Af Amer: 54 mL/min/{1.73_m2} — ABNORMAL LOW (ref >59)
Globulin, Total: 2.3 g/dL (ref 1.5–4.5)
Glucose: 87 mg/dL (ref 65–99)
Potassium: 4.8 mmol/L (ref 3.5–5.2)
Sodium: 141 mmol/L (ref 134–144)
Total Protein: 7.2 g/dL (ref 6.0–8.5)

## 2020-10-31 LAB — VITAMIN D 25 HYDROXY (VIT D DEFICIENCY, FRACTURES): Vit D, 25-Hydroxy: 22.8 ng/mL — ABNORMAL LOW (ref 30.0–100.0)

## 2020-11-04 ENCOUNTER — Telehealth: Payer: Self-pay | Admitting: Internal Medicine

## 2020-11-04 NOTE — Telephone Encounter (Signed)
Spoke to pt told him to take at least 1000 UI a day. Pt can buy it OTC. Pt verbalized understanding.  KP

## 2020-11-04 NOTE — Telephone Encounter (Signed)
Pt was told to take vitamin D and he called and asked the nurse to advise on what kind and/or how much he needs/ please advise

## 2020-11-10 ENCOUNTER — Telehealth: Payer: Self-pay

## 2020-11-10 NOTE — Telephone Encounter (Signed)
Please call pt to schedule appt for memory.  KP

## 2020-11-10 NOTE — Telephone Encounter (Unsigned)
Copied from [place] [phone]. Topic: General - Other >> Nov 10, 2020 11:02 AM Jodie Echevaria wrote: Reason for CRM: Patient daughter Hal Hope called in to say that the patient is having issues with his memory stated that it is getting progressively worst each day. And even at times will close one eye to see saying its his contact lenses which he does not wear. Asking for a call back please to discuss some alternatives Please call ph#  [phone]

## 2020-11-10 NOTE — Telephone Encounter (Signed)
Called pt to see if he has been doing the memory exercises that Dr. Army Melia recommend that he do. Pt could not remember what she told him to do until I told him them he remembered. Pt has not been doing any puzzles or playing card games. Pt verified that all information on his chart was correct without me coaching him thru it. Pt stated that we could speak to his wife about information regarding him. Told pt that on his next visit he would need to sign a paper stating we gave give information and/ or talk to her. Pt verbalized understanding.  KP

## 2020-12-10 DIAGNOSIS — M810 Age-related osteoporosis without current pathological fracture: Secondary | ICD-10-CM | POA: Diagnosis not present

## 2020-12-10 DIAGNOSIS — E213 Hyperparathyroidism, unspecified: Secondary | ICD-10-CM | POA: Diagnosis not present

## 2021-04-13 ENCOUNTER — Encounter: Payer: PPO | Admitting: Internal Medicine

## 2021-04-21 ENCOUNTER — Ambulatory Visit (INDEPENDENT_AMBULATORY_CARE_PROVIDER_SITE_OTHER): Payer: PPO | Admitting: Internal Medicine

## 2021-04-21 ENCOUNTER — Other Ambulatory Visit: Payer: Self-pay | Admitting: Internal Medicine

## 2021-04-21 ENCOUNTER — Ambulatory Visit
Admission: RE | Admit: 2021-04-21 | Discharge: 2021-04-21 | Disposition: A | Discharge status: Home / Self Care | Payer: PPO | Source: Ambulatory Visit | Attending: Internal Medicine | Admitting: Internal Medicine

## 2021-04-21 ENCOUNTER — Encounter: Payer: Self-pay | Admitting: Internal Medicine

## 2021-04-21 ENCOUNTER — Other Ambulatory Visit: Payer: Self-pay

## 2021-04-21 ENCOUNTER — Ambulatory Visit
Admission: RE | Admit: 2021-04-21 | Discharge: 2021-04-21 | Disposition: A | Discharge status: Home / Self Care | Payer: PPO | Attending: Internal Medicine | Admitting: Internal Medicine

## 2021-04-21 VITALS — BP 138/78 | HR 74 | Ht 72.0 in | Wt 174.0 lb

## 2021-04-21 DIAGNOSIS — M5416 Radiculopathy, lumbar region: Secondary | ICD-10-CM

## 2021-04-21 DIAGNOSIS — M25552 Pain in left hip: Secondary | ICD-10-CM | POA: Insufficient documentation

## 2021-04-21 DIAGNOSIS — M545 Low back pain, unspecified: Secondary | ICD-10-CM | POA: Diagnosis not present

## 2021-04-21 MED ORDER — PREDNISONE 10 MG PO TABS: 10.0000 mg | ORAL_TABLET | ORAL | 0 refills | Status: AC

## 2021-04-21 NOTE — Progress Notes (Signed)
Date:  04/21/2021   Name:  Alex Hughes   DOB:  05-20-1946   MRN:  [id number]   Chief Complaint: Hip Pain (Left Hip pain. Flared up 2 weeks ago. Unsteady gait, and pain constantly. )  Hip Pain  The incident occurred more than 1 week ago. There was no injury mechanism. The pain is present in the left hip. The quality of the pain is described as aching. The pain is moderate. The pain has been Fluctuating since onset. He has tried NSAIDs for the symptoms. The treatment provided no relief.   Lab Results  Component Value Date   CREATININE 1.31 (H) 10/30/2020   BUN 12 10/30/2020   NA 141 10/30/2020   K 4.8 10/30/2020   CL 105 10/30/2020   CO2 23 10/30/2020   No results found for: CHOL, HDL, LDLCALC, LDLDIRECT, TRIG, CHOLHDL Lab Results  Component Value Date   TSH 1.710 06/19/2018   No results found for: HGBA1C Lab Results  Component Value Date   WBC 7.7 10/30/2020   HGB 16.7 10/30/2020   HCT 50.8 10/30/2020   MCV 85 10/30/2020   PLT 251 10/30/2020   Lab Results  Component Value Date   ALT 9 10/30/2020   AST 18 10/30/2020   ALKPHOS 109 10/30/2020   BILITOT 0.7 10/30/2020     Review of Systems  Constitutional:  Negative for chills, fatigue and fever.  Respiratory:  Negative for chest tightness and shortness of breath.   Cardiovascular:  Negative for chest pain and leg swelling.  Gastrointestinal:  Negative for blood in stool and diarrhea.  Musculoskeletal:  Positive for arthralgias and gait problem. Negative for joint swelling.  Psychiatric/Behavioral:  Negative for dysphoric mood and sleep disturbance. The patient is not nervous/anxious.    Patient Active Problem List   Diagnosis Date Noted   Primary hyperparathyroidism ([place]) 04/21/2020   Vitamin D deficiency 04/21/2020   Hypercalcemia 06/20/2018   Right foot drop 05/11/2017   Right lumbar radiculopathy 11/30/2016   Tinnitus 11/30/2016   Essential hypertension 11/30/2016   Overweight (BMI 25.0-29.9) 11/30/2016     Allergies  Allergen Reactions   Lisinopril Swelling    Facial Swelling    Elemental Sulfur Other (See Comments)    Urinated blood   Sulfa Antibiotics Rash    History reviewed. No pertinent surgical history.  Social History   Tobacco Use   Smoking status: Never   Smokeless tobacco: Never  Substance Use Topics   Alcohol use: No   Drug use: No     Medication list has been reviewed and updated.  Current Meds  Medication Sig   amLODipine (NORVASC) 10 MG tablet TAKE 1 TABLET(10 MG) BY MOUTH DAILY   metoprolol tartrate (LOPRESSOR) 25 MG tablet TAKE 1 TABLET(25 MG) BY MOUTH DAILY   predniSONE (DELTASONE) 10 MG tablet Take 1 tablet (10 mg total) by mouth as directed for 6 days. Take 6,5,4,3,2,1 then stop    PHQ 2/9 Scores 04/21/2021 10/30/2020 04/21/2020 10/23/2019  PHQ - 2 Score 0 0 0 0  PHQ- 9 Score 0 0 2 -    GAD 7 : Generalized Anxiety Score 04/21/2021 10/30/2020 04/21/2020  Nervous, Anxious, on Edge 0 0 0  Control/stop worrying 0 0 0  Worry too much - different things 0 0 0  Trouble relaxing 0 0 0  Restless 0 0 0  Easily annoyed or irritable 0 0 0  Afraid - awful might happen 0 0 0  Total GAD 7 Score 0  0 0  Anxiety Difficulty Not difficult at all - Not difficult at all    BP Readings from Last 3 Encounters:  04/21/21 138/78  10/30/20 132/78  04/21/20 110/84   Lumbar MRI 2018: IMPRESSION: The dominant RIGHT-sided abnormality is at L4-5 where a caudally migrated free disc fragment, in conjunction with posterior element hypertrophy, and 3 mm of slip, along with a broad-based central protrusion contributes to severe stenosis and RIGHT greater than LEFT L5 nerve root impingement.   Similar less severe changes at L5-S1, where a central protrusion and a caudally migrated free fragment on the LEFT contributes to LEFT greater than RIGHT S1 nerve root impingement. BILATERAL foraminal narrowing at this level could affect either L5 nerve root.   Severe disc space  narrowing at L2-3, and retrolisthesis, without impingement.  Physical Exam Cardiovascular:     Rate and Rhythm: Normal rate and regular rhythm.  Pulmonary:     Effort: Pulmonary effort is normal.     Breath sounds: Normal breath sounds.  Abdominal:     General: Abdomen is flat.     Palpations: Abdomen is soft.  Musculoskeletal:     Lumbar back: No spasms, tenderness or bony tenderness. Negative right straight leg raise test and negative left straight leg raise test.     Right hip: No tenderness or bony tenderness. Normal range of motion.     Left hip: Bony tenderness (minimal) present. No tenderness. Normal range of motion.     Comments: Hamstring muscles tight bilaterally  Neurological:     General: No focal deficit present.     Mental Status: He is alert.     Sensory: Sensation is intact.     Motor: Motor function is intact.     Gait: Gait abnormal (left leg wide based).     Deep Tendon Reflexes:     Reflex Scores:      Patellar reflexes are 1+ on the right side and 1+ on the left side.   Wt Readings from Last 3 Encounters:  04/21/21 174 lb (78.9 kg)  10/30/20 176 lb (79.8 kg)  04/21/20 179 lb (81.2 kg)    BP 138/78 (BP Location: Right Arm, Patient Position: Sitting, Cuff Size: Normal)   Pulse 74   Ht 6' (1.829 m)   Wt 174 lb (78.9 kg)   SpO2 96%   BMI 23.60 kg/m   Assessment and Plan: 1. Acute hip pain, left No clear injury.  ROM fairly good but significant discomfort noted with weight bearing Will get imaging - may need to see SM - DG HIP UNILAT WITH PELVIS 2-3 VIEWS LEFT; Future  2. 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 Take 6,5,4,3,2,1 then stop  Dispense: 21 tablet; Refill: 0 - DG Lumbar Spine Complete; Future   Partially dictated using  Editor, commissioning. Any errors are unintentional.  Halina Maidens, MD [facility]  04/21/2021

## 2021-04-21 NOTE — Telephone Encounter (Signed)
Copied from [place] [id number]. Topic: Quick Communication - Rx Refill/Question >> Apr 21, 2021  2:11 PM Tessa Lerner A wrote: Medication: predniSONE (DELTASONE) 10 MG tablet   Has the patient contacted their pharmacy? Yes.   (Agent: If no, request that the patient contact the pharmacy for the refill.) (Agent: If yes, when and what did the pharmacy advise?)  Preferred Pharmacy (with phone number or street name): [facility] [place], [place] - [address]  Phone:  [phone] Fax:  [phone]  Agent: Please be advised that RX refills may take up to 3 business days. We ask that you follow-up with your pharmacy.

## 2021-04-22 ENCOUNTER — Telehealth: Payer: Self-pay

## 2021-04-22 NOTE — Telephone Encounter (Signed)
Copied from [place] [phone]. Topic: General - Other >> Apr 22, 2021 10:24 AM Robina Ade, Helene Kelp D wrote: Reason for CRM: Patient's wife Mrs. Gregary Signs called and would like when her husband image reports come in if office can call her with results since patient can not remember things. Call # [phone]

## 2021-04-23 NOTE — Telephone Encounter (Signed)
Will call wife when xray results come back.

## 2021-04-26 ENCOUNTER — Ambulatory Visit: Payer: PPO | Admitting: Internal Medicine

## 2021-04-26 NOTE — Telephone Encounter (Signed)
Pt is calling and would like his xray results

## 2021-04-26 NOTE — Progress Notes (Signed)
Called pt could not leave VM. VM was not set up.  [facility] nurse may give results to patient if they return call to clinic, a CRM has been created.  KP

## 2021-04-26 NOTE — Telephone Encounter (Signed)
Called pt left Vm to call back. Please give pt his results.  [facility] nurse may give results to patient if they return call to clinic, a CRM has been created.  KP

## 2021-04-26 NOTE — Telephone Encounter (Signed)
See result notes. 

## 2021-05-04 ENCOUNTER — Ambulatory Visit: Payer: Self-pay

## 2021-05-04 NOTE — Telephone Encounter (Signed)
Pt. [employer] in [employer] today. Tripped and fell. Hurt left thigh. Can walk on it "but it hurts." Can move leg and wiggle all toes. Wife is concerned because he has "fallen several time this month." No availability tomorrow. Spoke with Estill Bamberg in the practice and will forward triage for review. Please advise pt.

## 2021-05-04 NOTE — Telephone Encounter (Signed)
Reason for Disposition  MILD weakness (i.e., does not interfere with ability to work, go to school, normal activities) (Exception: mild weakness is a chronic symptom)  Answer Assessment - Initial Assessment Questions 1. MECHANISM: "How did the fall happen?"     Fell 2. DOMESTIC VIOLENCE AND ELDER ABUSE SCREENING: "Did you fall because someone pushed you or tried to hurt you?" If Yes, ask: "Are you safe now?"     No 3. ONSET: "When did the fall happen?" (e.g., minutes, hours, or days ago)     Today 4. LOCATION: "What part of the body hit the ground?" (e.g., back, buttocks, head, hips, knees, hands, head, stomach)     Left hip 5. INJURY: "Did you hurt (injure) yourself when you fell?" If Yes, ask: "What did you injure? Tell me more about this?" (e.g., body area; type of injury; pain severity)"     Left thigh 6. PAIN: "Is there any pain?" If Yes, ask: "How bad is the pain?" (e.g., Scale 1-10; or mild,  moderate, severe)   - NONE (0): No pain   - MILD (1-3): Doesn't interfere with normal activities    - MODERATE (4-7): Interferes with normal activities or awakens from sleep    - SEVERE (8-10): Excruciating pain, unable to do any normal activities      Now- 8 7. SIZE: For cuts, bruises, or swelling, ask: "How large is it?" (e.g., inches or centimeters)      No 8. PREGNANCY: "Is there any chance you are pregnant?" "When was your last menstrual period?"     N/a 9. OTHER SYMPTOMS: "Do you have any other symptoms?" (e.g., dizziness, fever, weakness; new onset or worsening).      No 10. CAUSE: "What do you think caused the fall (or falling)?" (e.g., tripped, dizzy spell)       Slipped  Protocols used: Falls and [facility]

## 2021-05-05 NOTE — Telephone Encounter (Signed)
Called and spoke with patient. Informed we will need to see him to evaluate. Says he has had multiple falls this month. Scheduled for Friday at 65 AM for left hip.

## 2021-05-07 ENCOUNTER — Telehealth: Payer: Self-pay

## 2021-05-07 ENCOUNTER — Ambulatory Visit: Payer: Self-pay | Admitting: Internal Medicine

## 2021-05-07 NOTE — Telephone Encounter (Unsigned)
Copied from [place] [phone]. Topic: Appointment Scheduling - Scheduling Inquiry for Clinic >> May 07, 2021  8:53 AM Lennox Solders wrote: Reason for CRM: FYI Pt is calling to cancel his appt today. Pt went to [facility] facility yesterday

## 2021-07-09 ENCOUNTER — Telehealth: Payer: Self-pay | Admitting: Internal Medicine

## 2021-07-09 NOTE — Telephone Encounter (Signed)
Copied from [place] [phone]. Topic: Medicare AWV >> Jul 09, 2021  1:56 PM Cher Nakai R wrote: Reason for CRM:  Left message for patient to call back and schedule Medicare Annual Wellness Visit (AWV) in office.   If unable to come into the office for AWV,  please offer to do virtually or by telephone.  No hx of AWV eligible for AWVI as of  08/17/2014  Schedule anytime with [facility] Health Advisor.      40 Minutes appointment   Any questions, please call me at [phone]

## 2021-11-23 ENCOUNTER — Other Ambulatory Visit: Payer: Self-pay | Admitting: Internal Medicine

## 2021-11-23 DIAGNOSIS — I10 Essential (primary) hypertension: Secondary | ICD-10-CM

## 2021-11-23 NOTE — Telephone Encounter (Signed)
Requested medication (s) are due for refill today: Yes  Requested medication (s) are on the active medication list: Yes  Last refill:  10/15/20  Future visit scheduled: No  Notes to clinic:  Pt.'s voice mailbox not set up. Unable to leave message to schedule an appointment.    Requested Prescriptions  Pending Prescriptions Disp Refills   amLODipine (NORVASC) 10 MG tablet [Pharmacy Med Name: AMLODIPINE BESYLATE 10MG  TABLETS] 90 tablet 1    Sig: TAKE 1 TABLET(10 MG) BY MOUTH DAILY     Cardiovascular: Calcium Channel Blockers 2 Failed - 11/23/2021  9:20 AM      Failed - Valid encounter within last 6 months    Recent Outpatient Visits           7 months ago Acute hip pain, left   [facility] Glean Hess, MD   1 year ago Essential hypertension   [facility] Glean Hess, MD   1 year ago Essential hypertension   [facility] Glean Hess, MD   2 years ago Essential hypertension   [facility] Glean Hess, MD   2 years ago Essential hypertension   [facility] Glean Hess, MD              Passed - Last BP in normal range    BP Readings from Last 1 Encounters:  04/21/21 138/78          Passed - Last Heart Rate in normal range    Pulse Readings from Last 1 Encounters:  04/21/21 74           metoprolol tartrate (LOPRESSOR) 25 MG tablet [Pharmacy Med Name: METOPROLOL TARTRATE 25MG  TABLETS] 90 tablet 1    Sig: TAKE 1 TABLET(25 MG) BY MOUTH DAILY     Cardiovascular:  Beta Blockers Failed - 11/23/2021  9:20 AM      Failed - Valid encounter within last 6 months    Recent Outpatient Visits           7 months ago Acute hip pain, left   [facility] Glean Hess, MD   1 year ago Essential hypertension   [facility] Glean Hess, MD   1 year ago Essential hypertension   [facility] Glean Hess, MD   2 years ago Essential hypertension   [facility] Glean Hess, MD   2 years ago Essential hypertension   [facility] Glean Hess, MD              Passed - Last BP in normal range    BP Readings from Last 1 Encounters:  04/21/21 138/78          Passed - Last Heart Rate in normal range    Pulse Readings from Last 1 Encounters:  04/21/21 74

## 2021-11-23 NOTE — Telephone Encounter (Signed)
Patient needs an appointment for future refills.  Please schedule.

## 2021-11-24 NOTE — Telephone Encounter (Signed)
Made appointment for next wednesday

## 2021-12-01 ENCOUNTER — Ambulatory Visit (INDEPENDENT_AMBULATORY_CARE_PROVIDER_SITE_OTHER): Payer: PPO | Admitting: Internal Medicine

## 2021-12-01 ENCOUNTER — Other Ambulatory Visit: Payer: Self-pay

## 2021-12-01 ENCOUNTER — Encounter: Payer: Self-pay | Admitting: Internal Medicine

## 2021-12-01 VITALS — BP 122/68 | HR 76 | Ht 72.0 in | Wt 166.0 lb

## 2021-12-01 DIAGNOSIS — E559 Vitamin D deficiency, unspecified: Secondary | ICD-10-CM

## 2021-12-01 DIAGNOSIS — M5416 Radiculopathy, lumbar region: Secondary | ICD-10-CM | POA: Diagnosis not present

## 2021-12-01 DIAGNOSIS — C44519 Basal cell carcinoma of skin of other part of trunk: Secondary | ICD-10-CM | POA: Insufficient documentation

## 2021-12-01 DIAGNOSIS — R739 Hyperglycemia, unspecified: Secondary | ICD-10-CM

## 2021-12-01 DIAGNOSIS — M109 Gout, unspecified: Secondary | ICD-10-CM | POA: Insufficient documentation

## 2021-12-01 DIAGNOSIS — E210 Primary hyperparathyroidism: Secondary | ICD-10-CM

## 2021-12-01 DIAGNOSIS — I10 Essential (primary) hypertension: Secondary | ICD-10-CM | POA: Diagnosis not present

## 2021-12-01 DIAGNOSIS — G3184 Mild cognitive impairment, so stated: Secondary | ICD-10-CM | POA: Insufficient documentation

## 2021-12-01 NOTE — Progress Notes (Signed)
Date:  12/01/2021   Name:  Alex Hughes   DOB:  06-05-1946   MRN:  [id number]   Chief Complaint: Hypertension  Hypertension This is a chronic problem. The problem is controlled. Pertinent negatives include no chest pain, headaches, palpitations or shortness of breath. Past treatments include beta blockers and calcium channel blockers. The current treatment provides significant improvement.  Hyperparathyroidism - followed by Endo.  DEXA showed osteoporosis last year. He was instructed to take vitamin D double dose due to low level last year.  Follow up appt next month.  Lab Results  Component Value Date   NA 141 10/30/2020   K 4.8 10/30/2020   CO2 23 10/30/2020   GLUCOSE 87 10/30/2020   BUN 12 10/30/2020   CREATININE 1.31 (H) 10/30/2020   CALCIUM 10.4 (H) 10/30/2020   GFRNONAA 54 (L) 10/30/2020   No results found for: CHOL, HDL, LDLCALC, LDLDIRECT, TRIG, CHOLHDL Lab Results  Component Value Date   TSH 1.710 06/19/2018   No results found for: HGBA1C Lab Results  Component Value Date   WBC 7.7 10/30/2020   HGB 16.7 10/30/2020   HCT 50.8 10/30/2020   MCV 85 10/30/2020   PLT 251 10/30/2020   Lab Results  Component Value Date   ALT 9 10/30/2020   AST 18 10/30/2020   ALKPHOS 109 10/30/2020   BILITOT 0.7 10/30/2020   Lab Results  Component Value Date   VD25OH 22.8 (L) 10/30/2020     Review of Systems  Constitutional:  Positive for unexpected weight change (as lost 10 lbs since last year). Negative for fatigue.  HENT:  Negative for nosebleeds and trouble swallowing.   Eyes:  Negative for visual disturbance.  Respiratory:  Negative for cough, chest tightness, shortness of breath and wheezing.   Cardiovascular:  Negative for chest pain, palpitations and leg swelling.  Gastrointestinal:  Negative for abdominal pain, constipation and diarrhea.  Genitourinary:  Negative for frequency, hematuria and urgency.  Musculoskeletal:  Positive for gait problem.  Neurological:   Positive for weakness (in right leg/foot). Negative for dizziness, light-headedness and headaches.  Hematological:  Negative for adenopathy. Does not bruise/bleed easily.  Psychiatric/Behavioral:  Positive for confusion and decreased concentration. Negative for sleep disturbance. The patient is not nervous/anxious.    Patient Active Problem List   Diagnosis Date Noted   Basal cell carcinoma of chest wall 12/01/2021   Gout 12/01/2021   Primary hyperparathyroidism ([place]) 04/21/2020   Vitamin D deficiency 04/21/2020   Hypercalcemia 06/20/2018   Right foot drop 05/11/2017   Right lumbar radiculopathy 11/30/2016   Tinnitus 11/30/2016   Essential hypertension 11/30/2016   Overweight (BMI 25.0-29.9) 11/30/2016    Allergies  Allergen Reactions   Lisinopril Swelling    Facial Swelling    Elemental Sulfur Other (See Comments)    Urinated blood   Sulfa Antibiotics Rash    History reviewed. No pertinent surgical history.  Social History   Tobacco Use   Smoking status: Never   Smokeless tobacco: Never  Substance Use Topics   Alcohol use: No   Drug use: No     Medication list has been reviewed and updated.  Current Meds  Medication Sig   amLODipine (NORVASC) 10 MG tablet TAKE 1 TABLET(10 MG) BY MOUTH DAILY   metoprolol tartrate (LOPRESSOR) 25 MG tablet TAKE 1 TABLET(25 MG) BY MOUTH DAILY    PHQ 2/9 Scores 12/01/2021 04/21/2021 10/30/2020 04/21/2020  PHQ - 2 Score 0 0 0 0  PHQ- 9 Score 6  0 0 2    GAD 7 : Generalized Anxiety Score 12/01/2021 04/21/2021 10/30/2020 04/21/2020  Nervous, Anxious, on Edge 0 0 0 0  Control/stop worrying 0 0 0 0  Worry too much - different things 0 0 0 0  Trouble relaxing 0 0 0 0  Restless 0 0 0 0  Easily annoyed or irritable 2 0 0 0  Afraid - awful might happen 0 0 0 0  Total GAD 7 Score 2 0 0 0  Anxiety Difficulty Somewhat difficult Not difficult at all - Not difficult at all    BP Readings from Last 3 Encounters:  12/01/21 122/68  04/21/21 138/78   10/30/20 132/78    Physical Exam Vitals and nursing note reviewed.  Constitutional:      General: He is not in acute distress.    Appearance: He is well-developed.  HENT:     Head: Normocephalic and atraumatic.  Cardiovascular:     Rate and Rhythm: Normal rate and regular rhythm.     Pulses: Normal pulses.  Pulmonary:     Effort: Pulmonary effort is normal. No respiratory distress.     Breath sounds: No wheezing or rhonchi.  Musculoskeletal:     Cervical back: Normal range of motion.     Right lower leg: No edema.     Left lower leg: No edema.  Lymphadenopathy:     Cervical: No cervical adenopathy.  Skin:    General: Skin is warm and dry.     Capillary Refill: Capillary refill takes less than 2 seconds.     Findings: No rash.  Neurological:     Mental Status: He is alert and oriented to person, place, and time.     Motor: Weakness (right LE and foot dorsiflexion) and atrophy present.     Gait: Gait abnormal.     Deep Tendon Reflexes:     Reflex Scores:      Patellar reflexes are 3+ on the right side and 1+ on the left side. Psychiatric:        Attention and Perception: Attention normal.        Mood and Affect: Mood normal.        Speech: Speech normal.        Behavior: Behavior normal.        Thought Content: Thought content normal.        Judgment: Judgment normal.    Wt Readings from Last 3 Encounters:  12/01/21 166 lb (75.3 kg)  04/21/21 174 lb (78.9 kg)  10/30/20 176 lb (79.8 kg)    BP 122/68    Pulse 76    Ht 6' (1.829 m)    Wt 166 lb (75.3 kg)    SpO2 97%    BMI 22.51 kg/m   Assessment and Plan: 1. Essential hypertension Clinically stable exam with well controlled BP. Tolerating medications without side effects at this time. Pt to continue current regimen and low sodium diet; benefits of regular exercise as able discussed. - Comprehensive metabolic panel - CBC with Differential/Platelet  2. Vitamin D deficiency Probably not taking supplement  currently. DEXA showed osteoporosis last year - VITAMIN D 25 Hydroxy (Vit-D Deficiency, Fractures)  3. Primary hyperparathyroidism ([place]) Has not had labs recently. Endo follow up in 2 weeks. - Parathyroid hormone, intact (no Ca)  4. MCI (mild cognitive impairment) with memory loss Family has concerns about decreasing memory/concentration. May be due to electrolyte, calcium imbalance - Vitamin B12 - Sedimentation rate - Lipid panel  5. Elevated serum glucose He has some ongoing slow weight loss. Check labs to rule out DM. - Hemoglobin A1c  6. Right lumbar radiculopathy Due to chronic lumbar pathology with right sided weakness He declined surgery in the past. Having some tripping and falls likely due to foot drop Encourage safety when walking - might benefit from a cane.   Partially dictated using Editor, commissioning. Any errors are unintentional.  Halina Maidens, MD [facility]  12/01/2021

## 2021-12-02 LAB — LIPID PANEL
Chol/HDL Ratio: 3.8 ratio (ref 0.0–5.0)
Cholesterol, Total: 173 mg/dL (ref 100–199)
HDL: 45 mg/dL (ref >39)
LDL Chol Calc (NIH): 106 mg/dL — ABNORMAL HIGH (ref 0–99)
Triglycerides: 123 mg/dL (ref 0–149)
VLDL Cholesterol Cal: 22 mg/dL (ref 5–40)

## 2021-12-02 LAB — HEMOGLOBIN A1C
Est. average glucose Bld gHb Est-mCnc: 105 mg/dL
Hgb A1c MFr Bld: 5.3 % (ref 4.8–5.6)

## 2021-12-02 LAB — VITAMIN D 25 HYDROXY (VIT D DEFICIENCY, FRACTURES): Vit D, 25-Hydroxy: 32.5 ng/mL (ref 30.0–100.0)

## 2021-12-02 LAB — COMPREHENSIVE METABOLIC PANEL
ALT: 11 IU/L (ref 0–44)
AST: 13 IU/L (ref 0–40)
Albumin/Globulin Ratio: 2.6 — ABNORMAL HIGH (ref 1.2–2.2)
Albumin: 4.9 g/dL — ABNORMAL HIGH (ref 3.7–4.7)
Alkaline Phosphatase: 110 IU/L (ref 44–121)
BUN/Creatinine Ratio: 11 (ref 10–24)
BUN: 16 mg/dL (ref 8–27)
Bilirubin Total: 0.6 mg/dL (ref 0.0–1.2)
CO2: 23 mmol/L (ref 20–29)
Calcium: 10.5 mg/dL — ABNORMAL HIGH (ref 8.6–10.2)
Chloride: 105 mmol/L (ref 96–106)
Creatinine, Ser: 1.4 mg/dL — ABNORMAL HIGH (ref 0.76–1.27)
Globulin, Total: 1.9 g/dL (ref 1.5–4.5)
Glucose: 94 mg/dL (ref 70–99)
Potassium: 4.3 mmol/L (ref 3.5–5.2)
Sodium: 142 mmol/L (ref 134–144)
Total Protein: 6.8 g/dL (ref 6.0–8.5)
eGFR: 53 mL/min/{1.73_m2} — ABNORMAL LOW (ref >59)

## 2021-12-02 LAB — SEDIMENTATION RATE: Sed Rate: 3 mm/hr (ref 0–30)

## 2021-12-02 LAB — CBC WITH DIFFERENTIAL/PLATELET
Basophils Absolute: 0.1 10*3/uL (ref 0.0–0.2)
Basos: 1 %
EOS (ABSOLUTE): 0.1 10*3/uL (ref 0.0–0.4)
Eos: 1 %
Hematocrit: 50 % (ref 37.5–51.0)
Hemoglobin: 16.9 g/dL (ref 13.0–17.7)
Immature Grans (Abs): 0 10*3/uL (ref 0.0–0.1)
Immature Granulocytes: 0 %
Lymphocytes Absolute: 1 10*3/uL (ref 0.7–3.1)
Lymphs: 14 %
MCH: 28.5 pg (ref 26.6–33.0)
MCHC: 33.8 g/dL (ref 31.5–35.7)
MCV: 85 fL (ref 79–97)
Monocytes Absolute: 0.5 10*3/uL (ref 0.1–0.9)
Monocytes: 7 %
Neutrophils Absolute: 5.5 10*3/uL (ref 1.4–7.0)
Neutrophils: 77 %
Platelets: 272 10*3/uL (ref 150–450)
RBC: 5.92 x10E6/uL — ABNORMAL HIGH (ref 4.14–5.80)
RDW: 13.1 % (ref 11.6–15.4)
WBC: 7.2 10*3/uL (ref 3.4–10.8)

## 2021-12-02 LAB — VITAMIN B12: Vitamin B-12: 951 pg/mL (ref 232–1245)

## 2021-12-02 LAB — PARATHYROID HORMONE, INTACT (NO CA): PTH: 65 pg/mL (ref 15–65)

## 2021-12-15 ENCOUNTER — Ambulatory Visit: Payer: PPO

## 2021-12-19 ENCOUNTER — Emergency Department: Payer: PPO

## 2021-12-19 ENCOUNTER — Other Ambulatory Visit: Payer: Self-pay

## 2021-12-19 ENCOUNTER — Inpatient Hospital Stay
Admission: EM | Admit: 2021-12-19 | Discharge: 2021-12-23 | DRG: 441 | Disposition: A | Discharge status: Skilled Nursing Facility | Payer: PPO | Attending: Internal Medicine | Admitting: Internal Medicine

## 2021-12-19 DIAGNOSIS — N390 Urinary tract infection, site not specified: Secondary | ICD-10-CM | POA: Diagnosis not present

## 2021-12-19 DIAGNOSIS — K7200 Acute and subacute hepatic failure without coma: Secondary | ICD-10-CM | POA: Diagnosis not present

## 2021-12-19 DIAGNOSIS — K767 Hepatorenal syndrome: Secondary | ICD-10-CM | POA: Diagnosis not present

## 2021-12-19 DIAGNOSIS — R7989 Other specified abnormal findings of blood chemistry: Secondary | ICD-10-CM | POA: Diagnosis not present

## 2021-12-19 DIAGNOSIS — R531 Weakness: Secondary | ICD-10-CM | POA: Diagnosis not present

## 2021-12-19 DIAGNOSIS — R410 Disorientation, unspecified: Secondary | ICD-10-CM | POA: Diagnosis not present

## 2021-12-19 DIAGNOSIS — L89321 Pressure ulcer of left buttock, stage 1: Secondary | ICD-10-CM | POA: Diagnosis present

## 2021-12-19 DIAGNOSIS — R8271 Bacteriuria: Secondary | ICD-10-CM | POA: Diagnosis present

## 2021-12-19 DIAGNOSIS — I10 Essential (primary) hypertension: Secondary | ICD-10-CM | POA: Diagnosis present

## 2021-12-19 DIAGNOSIS — E8729 Other acidosis: Secondary | ICD-10-CM | POA: Diagnosis present

## 2021-12-19 DIAGNOSIS — G9341 Metabolic encephalopathy: Secondary | ICD-10-CM | POA: Diagnosis not present

## 2021-12-19 DIAGNOSIS — N3001 Acute cystitis with hematuria: Secondary | ICD-10-CM | POA: Diagnosis not present

## 2021-12-19 DIAGNOSIS — R296 Repeated falls: Secondary | ICD-10-CM | POA: Diagnosis not present

## 2021-12-19 DIAGNOSIS — Z882 Allergy status to sulfonamides status: Secondary | ICD-10-CM | POA: Diagnosis not present

## 2021-12-19 DIAGNOSIS — K8020 Calculus of gallbladder without cholecystitis without obstruction: Secondary | ICD-10-CM | POA: Diagnosis present

## 2021-12-19 DIAGNOSIS — L89311 Pressure ulcer of right buttock, stage 1: Secondary | ICD-10-CM | POA: Diagnosis present

## 2021-12-19 DIAGNOSIS — R945 Abnormal results of liver function studies: Secondary | ICD-10-CM | POA: Diagnosis not present

## 2021-12-19 DIAGNOSIS — L8990 Pressure ulcer of unspecified site, unspecified stage: Secondary | ICD-10-CM | POA: Diagnosis present

## 2021-12-19 DIAGNOSIS — Z79899 Other long term (current) drug therapy: Secondary | ICD-10-CM | POA: Diagnosis not present

## 2021-12-19 DIAGNOSIS — Z8249 Family history of ischemic heart disease and other diseases of the circulatory system: Secondary | ICD-10-CM | POA: Diagnosis not present

## 2021-12-19 DIAGNOSIS — N179 Acute kidney failure, unspecified: Secondary | ICD-10-CM | POA: Diagnosis not present

## 2021-12-19 DIAGNOSIS — D696 Thrombocytopenia, unspecified: Secondary | ICD-10-CM | POA: Diagnosis present

## 2021-12-19 DIAGNOSIS — G9340 Encephalopathy, unspecified: Secondary | ICD-10-CM | POA: Diagnosis not present

## 2021-12-19 DIAGNOSIS — Z888 Allergy status to other drugs, medicaments and biological substances status: Secondary | ICD-10-CM

## 2021-12-19 DIAGNOSIS — N1831 Chronic kidney disease, stage 3a: Secondary | ICD-10-CM | POA: Diagnosis present

## 2021-12-19 DIAGNOSIS — Z9181 History of falling: Secondary | ICD-10-CM

## 2021-12-19 DIAGNOSIS — M21371 Foot drop, right foot: Secondary | ICD-10-CM | POA: Diagnosis not present

## 2021-12-19 DIAGNOSIS — R42 Dizziness and giddiness: Secondary | ICD-10-CM | POA: Diagnosis not present

## 2021-12-19 DIAGNOSIS — Z20822 Contact with and (suspected) exposure to covid-19: Secondary | ICD-10-CM | POA: Diagnosis present

## 2021-12-19 DIAGNOSIS — K828 Other specified diseases of gallbladder: Secondary | ICD-10-CM | POA: Diagnosis present

## 2021-12-19 DIAGNOSIS — E210 Primary hyperparathyroidism: Secondary | ICD-10-CM | POA: Diagnosis present

## 2021-12-19 DIAGNOSIS — N281 Cyst of kidney, acquired: Secondary | ICD-10-CM | POA: Diagnosis not present

## 2021-12-19 DIAGNOSIS — E878 Other disorders of electrolyte and fluid balance, not elsewhere classified: Secondary | ICD-10-CM | POA: Diagnosis not present

## 2021-12-19 DIAGNOSIS — Z01818 Encounter for other preprocedural examination: Secondary | ICD-10-CM | POA: Diagnosis not present

## 2021-12-19 DIAGNOSIS — Z0184 Encounter for antibody response examination: Secondary | ICD-10-CM

## 2021-12-19 DIAGNOSIS — Z833 Family history of diabetes mellitus: Secondary | ICD-10-CM

## 2021-12-19 DIAGNOSIS — I6782 Cerebral ischemia: Secondary | ICD-10-CM | POA: Diagnosis not present

## 2021-12-19 DIAGNOSIS — R7401 Elevation of levels of liver transaminase levels: Secondary | ICD-10-CM | POA: Diagnosis not present

## 2021-12-19 DIAGNOSIS — K859 Acute pancreatitis without necrosis or infection, unspecified: Secondary | ICD-10-CM | POA: Diagnosis not present

## 2021-12-19 DIAGNOSIS — R4182 Altered mental status, unspecified: Secondary | ICD-10-CM | POA: Diagnosis present

## 2021-12-19 DIAGNOSIS — R918 Other nonspecific abnormal finding of lung field: Secondary | ICD-10-CM | POA: Diagnosis not present

## 2021-12-19 DIAGNOSIS — I129 Hypertensive chronic kidney disease with stage 1 through stage 4 chronic kidney disease, or unspecified chronic kidney disease: Secondary | ICD-10-CM | POA: Diagnosis present

## 2021-12-19 DIAGNOSIS — N189 Chronic kidney disease, unspecified: Secondary | ICD-10-CM | POA: Diagnosis present

## 2021-12-19 LAB — RESP PANEL BY RT-PCR (FLU A&B, COVID) ARPGX2
Influenza A by PCR: NEGATIVE
Influenza B by PCR: NEGATIVE
SARS Coronavirus 2 by RT PCR: NEGATIVE

## 2021-12-19 LAB — CBC WITH DIFFERENTIAL/PLATELET
Abs Immature Granulocytes: 0.13 10*3/uL — ABNORMAL HIGH (ref 0.00–0.07)
Basophils Absolute: 0 10*3/uL (ref 0.0–0.1)
Basophils Relative: 0 %
Eosinophils Absolute: 0 10*3/uL (ref 0.0–0.5)
Eosinophils Relative: 0 %
HCT: 45.4 % (ref 39.0–52.0)
Hemoglobin: 15.3 g/dL (ref 13.0–17.0)
Immature Granulocytes: 1 %
Lymphocytes Relative: 1 %
Lymphs Abs: 0.1 10*3/uL — ABNORMAL LOW (ref 0.7–4.0)
MCH: 28.2 pg (ref 26.0–34.0)
MCHC: 33.7 g/dL (ref 30.0–36.0)
MCV: 83.8 fL (ref 80.0–100.0)
Monocytes Absolute: 0.5 10*3/uL (ref 0.1–1.0)
Monocytes Relative: 4 %
Neutro Abs: 12.8 10*3/uL — ABNORMAL HIGH (ref 1.7–7.7)
Neutrophils Relative %: 94 %
Platelets: 168 10*3/uL (ref 150–400)
RBC: 5.42 MIL/uL (ref 4.22–5.81)
RDW: 13.7 % (ref 11.5–15.5)
WBC: 13.6 10*3/uL — ABNORMAL HIGH (ref 4.0–10.5)
nRBC: 0 % (ref 0.0–0.2)

## 2021-12-19 LAB — URINALYSIS, COMPLETE (UACMP) WITH MICROSCOPIC
Glucose, UA: NEGATIVE mg/dL
Ketones, ur: NEGATIVE mg/dL
Leukocytes,Ua: NEGATIVE
Nitrite: NEGATIVE
Protein, ur: 100 mg/dL — AB
Specific Gravity, Urine: 1.019 (ref 1.005–1.030)
pH: 5 (ref 5.0–8.0)

## 2021-12-19 LAB — COMPREHENSIVE METABOLIC PANEL
ALT: 864 U/L — ABNORMAL HIGH (ref 0–44)
AST: 365 U/L — ABNORMAL HIGH (ref 15–41)
Albumin: 3.5 g/dL (ref 3.5–5.0)
Alkaline Phosphatase: 138 U/L — ABNORMAL HIGH (ref 38–126)
Anion gap: 13 (ref 5–15)
BUN: 40 mg/dL — ABNORMAL HIGH (ref 8–23)
CO2: 24 mmol/L (ref 22–32)
Calcium: 10.7 mg/dL — ABNORMAL HIGH (ref 8.9–10.3)
Chloride: 101 mmol/L (ref 98–111)
Creatinine, Ser: 3.11 mg/dL — ABNORMAL HIGH (ref 0.61–1.24)
GFR, Estimated: 20 mL/min — ABNORMAL LOW (ref >60)
Glucose, Bld: 112 mg/dL — ABNORMAL HIGH (ref 70–99)
Potassium: 3.5 mmol/L (ref 3.5–5.1)
Sodium: 138 mmol/L (ref 135–145)
Total Bilirubin: 4.5 mg/dL — ABNORMAL HIGH (ref 0.3–1.2)
Total Protein: 6.4 g/dL — ABNORMAL LOW (ref 6.5–8.1)

## 2021-12-19 LAB — TROPONIN I (HIGH SENSITIVITY)
Troponin I (High Sensitivity): 9 ng/L (ref <18)
Troponin I (High Sensitivity): 8 ng/L (ref <18)

## 2021-12-19 LAB — AMMONIA: Ammonia: 14 umol/L (ref 9–35)

## 2021-12-19 LAB — LIPASE, BLOOD: Lipase: 159 U/L — ABNORMAL HIGH (ref 11–51)

## 2021-12-19 LAB — ACETAMINOPHEN LEVEL: Acetaminophen (Tylenol), Serum: <10 ug/mL — ABNORMAL LOW (ref 10–30)

## 2021-12-19 LAB — SALICYLATE LEVEL: Salicylate Lvl: <7 mg/dL — ABNORMAL LOW (ref 7.0–30.0)

## 2021-12-19 MED ORDER — LACTATED RINGERS IV SOLN: INTRAVENOUS | Status: DC

## 2021-12-19 MED ORDER — SODIUM CHLORIDE 0.9 % IV SOLN: Freq: Once | INTRAVENOUS | Status: AC

## 2021-12-19 MED ORDER — LACTATED RINGERS IV BOLUS
1000.0000 mL | Freq: Once | INTRAVENOUS | Status: AC
  Administered 2021-12-19: 1000 mL via INTRAVENOUS

## 2021-12-19 MED ORDER — SODIUM CHLORIDE 0.9 % IV SOLN
1.0000 g | Freq: Once | INTRAVENOUS | Status: AC
  Administered 2021-12-19: 1 g via INTRAVENOUS
  Filled 2021-12-19: qty 10

## 2021-12-19 NOTE — ED Provider Notes (Signed)
? ?[facility] ?Provider Note ? ? ? Event Date/Time  ? First MD Initiated Contact with Patient 12/19/21 1740   ?  (approximate) ? ? ?History  ? ?Weakness ? ? ?HPI ? ?Alex Hughes is a 76 y.o. male  who per office note dated 12/01/21 has history of HTN, mild cognitive impairment with memory loss, who presents to the emergency department today because of concern for balance issues. Apparently he has had issues with his balance for some time however family has noticed it has been worse over the past two days. He has had multiple falls, although he denies any injuries from his falls.  He does not feel particularly weak in any extremity.  In addition to his balance issues family states that they have noticed slurred speech over the past patient did well.  Wife was at bedside also thought he might of had a fever yesterday.  Patient denies any pain. ? ? ?Physical Exam  ? ?Triage Vital Signs: ?ED Triage Vitals  ?Enc Vitals Group  ?   BP 12/19/21 1733 121/73  ?   Pulse Rate 12/19/21 1733 (!) 102  ?   Resp 12/19/21 1733 17  ?   Temp 12/19/21 1733 98.2 ?F (36.8 ?C)  ?   Temp Source 12/19/21 1733 Oral  ?   SpO2 12/19/21 1728 98 %  ?   Weight 12/19/21 1734 166 lb (75.3 kg)  ?   Height 12/19/21 1734 6' (1.829 m)  ?   Head Circumference --   ?   Peak Flow --   ?   Pain Score 12/19/21 1734 0  ?   Pain Loc --   ?   Pain Edu? --   ?   Excl. in [place]? --   ? ? ?Most recent vital signs: ?Vitals:  ? 12/19/21 1728 12/19/21 1733  ?BP:  121/73  ?Pulse:  (!) 102  ?Resp:  17  ?Temp:  98.2 ?F (36.8 ?C)  ?SpO2: 98% 92%  ? ? ?General: Awake, no distress.  ?CV:  Good peripheral perfusion. Regular rate and rhythm ?Resp:  Normal effort. Clear to auscultation ?Abd:  No distention.  ?Neuro:  Some weakness to right eyelid (family states has been present for a long time), otherwise face symmetric. Tongue midline. No upper extremity pronator drift. Strength 5/5 in lower extremities.  ? ? ?ED Results / Procedures / Treatments   ? ?Labs ?(all labs ordered are listed, but only abnormal results are displayed) ?Labs Reviewed  ?CBC WITH DIFFERENTIAL/PLATELET - Abnormal; Notable for the following components:  ?    Result Value  ? WBC 13.6 (*)   ? Neutro Abs 12.8 (*)   ? Lymphs Abs 0.1 (*)   ? Abs Immature Granulocytes 0.13 (*)   ? All other components within normal limits  ?RESP PANEL BY RT-PCR (FLU A&B, COVID) ARPGX2  ?URINALYSIS, COMPLETE (UACMP) WITH MICROSCOPIC  ?COMPREHENSIVE METABOLIC PANEL  ?TROPONIN I (HIGH SENSITIVITY)  ? ? ? ?EKG ? ?None ? ? ?RADIOLOGY ?I independently interpreted and visualized the CT head. My interpretation: No acute bleed. No large mass.  ?Radiology interpretation:  ?IMPRESSION:  ?1. No acute intracranial abnormality.  ?2. Age related atrophy and chronic small vessel ischemic change.  ?   ? ? ? ? ?PROCEDURES: ? ?Critical Care performed: No ? ?Procedures ? ? ?MEDICATIONS ORDERED IN ED: ?Medications - No data to display ? ? ?IMPRESSION / MDM / ASSESSMENT AND PLAN / ED COURSE  ?I reviewed the triage vital  signs and the nursing notes. ?             ?               ? ?Differential diagnosis includes, but is not limited to, intracranial process, infection, anemia, electrolyte abnormality. ? ?Patient presented to the emergency department today because of concerns for increased balance issues and confusion over the past few days.  Patient's urine is concerning here for urinary tract infection.  Did get a CT scan of the head which did not show any acute intracranial process.  Blood work was notable for both AKI as well as elevated LFTs.  Elevated LFTs I did obtain a right upper quadrant ultrasound which although it showed some gallstones did not show any other complicating issues.  Patient was given IV antibiotics here in the emergency department.  Patient was given IV fluids.  Discussed with Dr. Posey Pronto with the hospital service will plan on admission.  Discussed findings and plan with patient and family. ? ?FINAL CLINICAL  IMPRESSION(S) / ED DIAGNOSES  ? ?Final diagnoses:  ?Elevated LFTs  ?Weakness  ?Lower urinary tract infectious disease  ?AKI (acute kidney injury) ([place])  ? ? ? ?Note:  This document was prepared using Dragon voice recognition software and may include unintentional dictation errors. ? ?  ?Nance Pear, MD ?12/19/21 2158 ? ?

## 2021-12-19 NOTE — H&P (Signed)
History and Physical    Patient: Alex Hughes OEV:[id number] DOB: 07/29/1946 DOA: 12/19/2021 DOS: the patient was seen and examined on 12/20/2021 PCP: Glean Hess, MD  Patient coming from: Home  Chief Complaint:  Chief Complaint  Patient presents with   Weakness    HPI: Alex Hughes is a 76 y.o. male with medical history significant of HTN and back pain coming for AMS and weakness.   Since Friday decline in ambulation and confusion.  Here for UTI and AKI . AMS and confusion for past few weeks , but falls worse since last few days.  Pt has chronic back issue and his right leg is weakness and has drop foot.  Daughter says he doe not take tylenol but has aleve.    Review of Systems: Review of Systems  Unable to perform ROS: Mental status change  Musculoskeletal:        Falls   Neurological:  Positive for weakness.   Past Medical History:  Diagnosis Date   FH: heart disease 11/30/2016   Hypertension    History reviewed. No pertinent surgical history. Social History:  reports that he has never smoked. He has never used smokeless tobacco. He reports that he does not drink alcohol and does not use drugs.  Allergies  Allergen Reactions   Lisinopril Swelling    Facial Swelling    Elemental Sulfur Other (See Comments)    Urinated blood   Sulfa Antibiotics Rash   Family History  Problem Relation Age of Onset   Diabetes Father    Heart disease Father    Prior to Admission medications   Medication Sig Start Date End Date Taking? Authorizing Provider  amLODipine (NORVASC) 10 MG tablet TAKE 1 TABLET(10 MG) BY MOUTH DAILY 12/01/21   Glean Hess, MD  metoprolol tartrate (LOPRESSOR) 25 MG tablet TAKE 1 TABLET(25 MG) BY MOUTH DAILY 12/01/21   Glean Hess, MD   Physical Exam: Vitals:   12/19/21 1940 12/19/21 2100 12/19/21 2200 12/19/21 2300  BP:  125/84 117/79 123/75  Pulse:  90 99 98  Resp:      Temp: 98.2 F (36.8 C)   98.2 F (36.8 C)  TempSrc:    Oral   SpO2:  97% 95% 96%  Weight:      Height:      Physical Exam Vitals and nursing note reviewed.  Constitutional:      General: He is not in acute distress.    Appearance: Normal appearance. He is not ill-appearing, toxic-appearing or diaphoretic.  HENT:     Head: Normocephalic and atraumatic.     Right Ear: Hearing and external ear normal.     Left Ear: Hearing and external ear normal.     Nose: Nose normal. No nasal deformity.     Mouth/Throat:     Lips: Pink.     Mouth: Mucous membranes are moist.     Tongue: No lesions.     Pharynx: Oropharynx is clear.  Eyes:     General: No scleral icterus.    Extraocular Movements: Extraocular movements intact.     Pupils: Pupils are equal, round, and reactive to light.  Neck:     Vascular: No carotid bruit.  Cardiovascular:     Rate and Rhythm: Normal rate and regular rhythm.     Pulses: Normal pulses.     Heart sounds: Normal heart sounds.  Pulmonary:     Effort: Pulmonary effort is normal.     Breath sounds:  Normal breath sounds.  Abdominal:     General: Bowel sounds are normal. There is no distension.     Palpations: Abdomen is soft. There is no mass.     Tenderness: There is no abdominal tenderness. There is no guarding.     Hernia: No hernia is present.  Musculoskeletal:        General: No swelling.     Right lower leg: No edema.     Left lower leg: No edema.  Skin:    General: Skin is warm.  Neurological:     General: No focal deficit present.     Mental Status: He is alert and oriented to person, place, and time.     Cranial Nerves: Cranial nerves 2-12 are intact. No cranial nerve deficit.     Motor: Motor function is intact. No weakness.  Psychiatric:        Attention and Perception: Attention normal.        Mood and Affect: Mood normal.        Speech: Speech normal.        Behavior: Behavior normal. Behavior is cooperative.        Cognition and Memory: Cognition normal.     Data Reviewed: Results for orders  placed or performed during the hospital encounter of 12/19/21 (from the past 24 hour(s))  Resp Panel by RT-PCR (Flu A&B, Covid) Nasopharyngeal Swab     Status: None   Collection Time: 12/19/21  6:18 PM   Specimen: Nasopharyngeal Swab; Nasopharyngeal(NP) swabs in vial transport medium  Result Value Ref Range   SARS Coronavirus 2 by RT PCR NEGATIVE NEGATIVE   Influenza A by PCR NEGATIVE NEGATIVE   Influenza B by PCR NEGATIVE NEGATIVE  Urinalysis, Complete w Microscopic Nasopharyngeal Swab     Status: Abnormal   Collection Time: 12/19/21  6:18 PM  Result Value Ref Range   Color, Urine AMBER (A) YELLOW   APPearance CLOUDY (A) CLEAR   Specific Gravity, Urine 1.019 1.005 - 1.030   pH 5.0 5.0 - 8.0   Glucose, UA NEGATIVE NEGATIVE mg/dL   Hgb urine dipstick MODERATE (A) NEGATIVE   Bilirubin Urine SMALL (A) NEGATIVE   Ketones, ur NEGATIVE NEGATIVE mg/dL   Protein, ur 100 (A) NEGATIVE mg/dL   Nitrite NEGATIVE NEGATIVE   Leukocytes,Ua NEGATIVE NEGATIVE   RBC / HPF 6-10 0 - 5 RBC/hpf   WBC, UA 21-50 0 - 5 WBC/hpf   Bacteria, UA MANY (A) NONE SEEN   Squamous Epithelial / LPF 21-50 0 - 5   WBC Clumps PRESENT    Mucus PRESENT    Granular Casts, UA PRESENT   Comprehensive metabolic panel     Status: Abnormal   Collection Time: 12/19/21  6:18 PM  Result Value Ref Range   Sodium 138 135 - 145 mmol/L   Potassium 3.5 3.5 - 5.1 mmol/L   Chloride 101 98 - 111 mmol/L   CO2 24 22 - 32 mmol/L   Glucose, Bld 112 (H) 70 - 99 mg/dL   BUN 40 (H) 8 - 23 mg/dL   Creatinine, Ser 3.11 (H) 0.61 - 1.24 mg/dL   Calcium 10.7 (H) 8.9 - 10.3 mg/dL   Total Protein 6.4 (L) 6.5 - 8.1 g/dL   Albumin 3.5 3.5 - 5.0 g/dL   AST 365 (H) 15 - 41 U/L   ALT 864 (H) 0 - 44 U/L   Alkaline Phosphatase 138 (H) 38 - 126 U/L   Total Bilirubin 4.5 (H) 0.3 -  1.2 mg/dL   GFR, Estimated 20 (L) >60 mL/min   Anion gap 13 5 - 15  CBC with Differential     Status: Abnormal   Collection Time: 12/19/21  6:18 PM  Result Value Ref  Range   WBC 13.6 (H) 4.0 - 10.5 K/uL   RBC 5.42 4.22 - 5.81 MIL/uL   Hemoglobin 15.3 13.0 - 17.0 g/dL   HCT 45.4 39.0 - 52.0 %   MCV 83.8 80.0 - 100.0 fL   MCH 28.2 26.0 - 34.0 pg   MCHC 33.7 30.0 - 36.0 g/dL   RDW 13.7 11.5 - 15.5 %   Platelets 168 150 - 400 K/uL   nRBC 0.0 0.0 - 0.2 %   Neutrophils Relative % 94 %   Neutro Abs 12.8 (H) 1.7 - 7.7 K/uL   Lymphocytes Relative 1 %   Lymphs Abs 0.1 (L) 0.7 - 4.0 K/uL   Monocytes Relative 4 %   Monocytes Absolute 0.5 0.1 - 1.0 K/uL   Eosinophils Relative 0 %   Eosinophils Absolute 0.0 0.0 - 0.5 K/uL   Basophils Relative 0 %   Basophils Absolute 0.0 0.0 - 0.1 K/uL   Immature Granulocytes 1 %   Abs Immature Granulocytes 0.13 (H) 0.00 - 0.07 K/uL  Troponin I (High Sensitivity)     Status: None   Collection Time: 12/19/21  6:18 PM  Result Value Ref Range   Troponin I (High Sensitivity) 9 <18 ng/L  Ammonia     Status: None   Collection Time: 12/19/21  8:40 PM  Result Value Ref Range   Ammonia 14 9 - 35 umol/L  Troponin I (High Sensitivity)     Status: None   Collection Time: 12/19/21 10:25 PM  Result Value Ref Range   Troponin I (High Sensitivity) 8 <18 ng/L  Lipase, blood     Status: Abnormal   Collection Time: 12/19/21 10:25 PM  Result Value Ref Range   Lipase 159 (H) 11 - 51 U/L  Acetaminophen level     Status: Abnormal   Collection Time: 12/19/21 10:25 PM  Result Value Ref Range   Acetaminophen (Tylenol), Serum <10 (L) 10 - 30 ug/mL  Salicylate level     Status: Abnormal   Collection Time: 12/19/21 10:25 PM  Result Value Ref Range   Salicylate Lvl <2.9 (L) 7.0 - 30.0 mg/dL     Assessment and Plan: No notes have been filed under this hospital service. Service: Hospitalist >AMS: D/D Include metabolic encephalopathy.  CT head negative.  No trauma.   >Suspected Acute liver failure without coma: Pt is a 76 y/o male coming with AMS and weakness found to have elevated LFT with bilirubin, with no history of any liver  disease or tylenol. No autoimmune history.  PT/INR is pending. Ammonia is normal. D/d include sepsis/ UTI / cholangitis ? / medication/ viral infection/ autoimmune.  Additional testing has been ordered and pending. GI has been consulted . Avoid hepatotoxic agents.  CMP 12/19/2021 12/01/2021 10/30/2020  Glucose 112(H) 94 87  BUN 40(H) 16 12  Creatinine 3.11(H) 1.40(H) 1.31(H)  Sodium 138 142 141  Potassium 3.5 4.3 4.8  Chloride 101 105 105  CO2 '24 23 23  '$ Calcium 10.7(H) 10.5(H) 10.4(H)  Total Protein 6.4(L) 6.8 7.2  Total Bilirubin 4.5(H) 0.6 0.7  Alkaline Phos 138(H) 110 109  AST 365(H) 13 18  ALT 864(H) 11 9     >AKI on CKD: ? UTI? Hepatorenal syndrome. Nephrology has been consulted.  MIVF, avoid  contrast studies.  Lab Results  Component Value Date   CREATININE 3.11 (H) 12/19/2021   CREATININE 1.40 (H) 12/01/2021   CREATININE 1.31 (H) 10/30/2020   > UTI: We will continue pt on rocephin follow C/S.  >Htn: Blood pressure 123/75, pulse 98, temperature 98.2 F (36.8 C), temperature source Oral, resp. rate 17, height 6' (1.829 m), weight 75.3 kg, SpO2 96 %. BP is low we will monitor.  We will also continue metoprolol and hold amlodipine.    Advance Care Planning:   Code Status: Full Code    Consults:  GI- Dr.Russo. Nephrology- Dr.Bhutani.  Family Communication:  Cledis, Sohn (Spouse)  [phone] (Mobile)  Severity of Illness: The appropriate patient status for this patient is INPATIENT. Inpatient status is judged to be reasonable and necessary in order to provide the required intensity of service to ensure the patient's safety. The patient's presenting symptoms, physical exam findings, and initial radiographic and laboratory data in the context of their chronic comorbidities is felt to place them at high risk for further clinical deterioration. Furthermore, it is not anticipated that the patient will be medically stable for discharge from the hospital within 2 midnights  of admission.   * I certify that at the point of admission it is my clinical judgment that the patient will require inpatient hospital care spanning beyond 2 midnights from the point of admission due to high intensity of service, high risk for further deterioration and high frequency of surveillance required.*  Author: Para Skeans, MD 12/20/2021 12:40 AM  For on call review [email or web address].

## 2021-12-19 NOTE — ED Triage Notes (Incomplete)
Pt family Called EMS due to pt becoming increasingly weak. Pt is alert to self and place only.  ?

## 2021-12-20 ENCOUNTER — Inpatient Hospital Stay: Payer: PPO

## 2021-12-20 ENCOUNTER — Encounter: Payer: Self-pay | Admitting: Internal Medicine

## 2021-12-20 DIAGNOSIS — Z888 Allergy status to other drugs, medicaments and biological substances status: Secondary | ICD-10-CM | POA: Diagnosis not present

## 2021-12-20 DIAGNOSIS — L89311 Pressure ulcer of right buttock, stage 1: Secondary | ICD-10-CM | POA: Diagnosis present

## 2021-12-20 DIAGNOSIS — N1831 Chronic kidney disease, stage 3a: Secondary | ICD-10-CM | POA: Diagnosis present

## 2021-12-20 DIAGNOSIS — K8020 Calculus of gallbladder without cholecystitis without obstruction: Secondary | ICD-10-CM | POA: Diagnosis present

## 2021-12-20 DIAGNOSIS — Z9181 History of falling: Secondary | ICD-10-CM | POA: Diagnosis not present

## 2021-12-20 DIAGNOSIS — Z833 Family history of diabetes mellitus: Secondary | ICD-10-CM | POA: Diagnosis not present

## 2021-12-20 DIAGNOSIS — R410 Disorientation, unspecified: Secondary | ICD-10-CM | POA: Diagnosis not present

## 2021-12-20 DIAGNOSIS — D696 Thrombocytopenia, unspecified: Secondary | ICD-10-CM | POA: Diagnosis present

## 2021-12-20 DIAGNOSIS — R4182 Altered mental status, unspecified: Secondary | ICD-10-CM | POA: Diagnosis present

## 2021-12-20 DIAGNOSIS — I129 Hypertensive chronic kidney disease with stage 1 through stage 4 chronic kidney disease, or unspecified chronic kidney disease: Secondary | ICD-10-CM | POA: Diagnosis present

## 2021-12-20 DIAGNOSIS — K828 Other specified diseases of gallbladder: Secondary | ICD-10-CM | POA: Diagnosis present

## 2021-12-20 DIAGNOSIS — Z882 Allergy status to sulfonamides status: Secondary | ICD-10-CM | POA: Diagnosis not present

## 2021-12-20 DIAGNOSIS — E8729 Other acidosis: Secondary | ICD-10-CM | POA: Diagnosis present

## 2021-12-20 DIAGNOSIS — M21371 Foot drop, right foot: Secondary | ICD-10-CM | POA: Diagnosis present

## 2021-12-20 DIAGNOSIS — K767 Hepatorenal syndrome: Secondary | ICD-10-CM | POA: Diagnosis present

## 2021-12-20 DIAGNOSIS — Z8249 Family history of ischemic heart disease and other diseases of the circulatory system: Secondary | ICD-10-CM | POA: Diagnosis not present

## 2021-12-20 DIAGNOSIS — N390 Urinary tract infection, site not specified: Secondary | ICD-10-CM | POA: Diagnosis present

## 2021-12-20 DIAGNOSIS — G9341 Metabolic encephalopathy: Secondary | ICD-10-CM | POA: Diagnosis present

## 2021-12-20 DIAGNOSIS — E878 Other disorders of electrolyte and fluid balance, not elsewhere classified: Secondary | ICD-10-CM | POA: Diagnosis present

## 2021-12-20 DIAGNOSIS — R945 Abnormal results of liver function studies: Secondary | ICD-10-CM | POA: Diagnosis not present

## 2021-12-20 DIAGNOSIS — N179 Acute kidney failure, unspecified: Secondary | ICD-10-CM | POA: Diagnosis present

## 2021-12-20 DIAGNOSIS — Z20822 Contact with and (suspected) exposure to covid-19: Secondary | ICD-10-CM | POA: Diagnosis present

## 2021-12-20 DIAGNOSIS — K7200 Acute and subacute hepatic failure without coma: Secondary | ICD-10-CM | POA: Diagnosis present

## 2021-12-20 DIAGNOSIS — R296 Repeated falls: Secondary | ICD-10-CM | POA: Diagnosis present

## 2021-12-20 DIAGNOSIS — L8990 Pressure ulcer of unspecified site, unspecified stage: Secondary | ICD-10-CM | POA: Diagnosis present

## 2021-12-20 DIAGNOSIS — L89321 Pressure ulcer of left buttock, stage 1: Secondary | ICD-10-CM | POA: Diagnosis present

## 2021-12-20 DIAGNOSIS — E210 Primary hyperparathyroidism: Secondary | ICD-10-CM | POA: Diagnosis present

## 2021-12-20 DIAGNOSIS — Z79899 Other long term (current) drug therapy: Secondary | ICD-10-CM | POA: Diagnosis not present

## 2021-12-20 LAB — URINE DRUG SCREEN, QUALITATIVE (ARMC ONLY)
Amphetamines, Ur Screen: NOT DETECTED
Barbiturates, Ur Screen: NOT DETECTED
Benzodiazepine, Ur Scrn: NOT DETECTED
Cannabinoid 50 Ng, Ur ~~LOC~~: NOT DETECTED
Cocaine Metabolite,Ur ~~LOC~~: NOT DETECTED
MDMA (Ecstasy)Ur Screen: NOT DETECTED
Methadone Scn, Ur: NOT DETECTED
Opiate, Ur Screen: NOT DETECTED
Phencyclidine (PCP) Ur S: NOT DETECTED
Tricyclic, Ur Screen: NOT DETECTED

## 2021-12-20 LAB — IRON AND TIBC
Iron: 23 ug/dL — ABNORMAL LOW (ref 45–182)
Saturation Ratios: 10 % — ABNORMAL LOW (ref 17.9–39.5)
TIBC: 224 ug/dL — ABNORMAL LOW (ref 250–450)
UIBC: 201 ug/dL

## 2021-12-20 LAB — COMPREHENSIVE METABOLIC PANEL
ALT: 554 U/L — ABNORMAL HIGH (ref 0–44)
AST: 121 U/L — ABNORMAL HIGH (ref 15–41)
Albumin: 3.2 g/dL — ABNORMAL LOW (ref 3.5–5.0)
Alkaline Phosphatase: 142 U/L — ABNORMAL HIGH (ref 38–126)
Anion gap: 7 (ref 5–15)
BUN: 36 mg/dL — ABNORMAL HIGH (ref 8–23)
CO2: 26 mmol/L (ref 22–32)
Calcium: 10 mg/dL (ref 8.9–10.3)
Chloride: 106 mmol/L (ref 98–111)
Creatinine, Ser: 2.71 mg/dL — ABNORMAL HIGH (ref 0.61–1.24)
GFR, Estimated: 24 mL/min — ABNORMAL LOW (ref >60)
Glucose, Bld: 91 mg/dL (ref 70–99)
Potassium: 4 mmol/L (ref 3.5–5.1)
Sodium: 139 mmol/L (ref 135–145)
Total Bilirubin: 3.4 mg/dL — ABNORMAL HIGH (ref 0.3–1.2)
Total Protein: 6.1 g/dL — ABNORMAL LOW (ref 6.5–8.1)

## 2021-12-20 LAB — HEPATITIS PANEL, ACUTE
HCV Ab: NONREACTIVE
Hep A IgM: NONREACTIVE
Hep B C IgM: NONREACTIVE
Hepatitis B Surface Ag: NONREACTIVE

## 2021-12-20 LAB — T4, FREE: Free T4: 1.01 ng/dL (ref 0.61–1.12)

## 2021-12-20 LAB — CK: Total CK: 51 U/L (ref 49–397)

## 2021-12-20 LAB — OSMOLALITY: Osmolality: 300 mOsm/kg — ABNORMAL HIGH (ref 275–295)

## 2021-12-20 LAB — FERRITIN: Ferritin: 1919 ng/mL — ABNORMAL HIGH (ref 24–336)

## 2021-12-20 LAB — PROTIME-INR
INR: 1.2 (ref 0.8–1.2)
Prothrombin Time: 14.7 seconds (ref 11.4–15.2)

## 2021-12-20 LAB — TSH: TSH: 0.269 u[IU]/mL — ABNORMAL LOW (ref 0.350–4.500)

## 2021-12-20 LAB — ETHANOL: Alcohol, Ethyl (B): <10 mg/dL (ref <10)

## 2021-12-20 LAB — SAR COV2 SEROLOGY (COVID19)AB(IGG),IA: SARS-CoV-2 Ab, IgG: NONREACTIVE

## 2021-12-20 LAB — GAMMA GT: GGT: 218 U/L — ABNORMAL HIGH (ref 7–50)

## 2021-12-20 LAB — VITAMIN B12: Vitamin B-12: 1005 pg/mL — ABNORMAL HIGH (ref 180–914)

## 2021-12-20 MED ORDER — SODIUM CHLORIDE 0.9 % IV SOLN: INTRAVENOUS | Status: DC

## 2021-12-20 MED ORDER — SODIUM CHLORIDE 0.9 % IV SOLN
1.0000 g | INTRAVENOUS | Status: DC
  Administered 2021-12-20: 1 g via INTRAVENOUS
  Filled 2021-12-20 (×2): qty 10

## 2021-12-20 MED ORDER — HALOPERIDOL LACTATE 5 MG/ML IJ SOLN
1.0000 mg | Freq: Once | INTRAMUSCULAR | Status: AC
  Administered 2021-12-20: 1 mg via INTRAVENOUS
  Filled 2021-12-20: qty 1

## 2021-12-20 MED ORDER — THIAMINE HCL 100 MG PO TABS
100.0000 mg | ORAL_TABLET | Freq: Every day | ORAL | Status: DC
  Administered 2021-12-20 – 2021-12-23 (×3): 100 mg via ORAL
  Filled 2021-12-20 (×3): qty 1

## 2021-12-20 MED ORDER — PANTOPRAZOLE SODIUM 40 MG IV SOLR
40.0000 mg | Freq: Two times a day (BID) | INTRAVENOUS | Status: DC
  Administered 2021-12-20: 40 mg via INTRAVENOUS
  Filled 2021-12-20: qty 10

## 2021-12-20 MED ORDER — HALOPERIDOL LACTATE 5 MG/ML IJ SOLN: 1.0000 mg | Freq: Four times a day (QID) | INTRAMUSCULAR | Status: DC | PRN

## 2021-12-20 MED ORDER — LACTATED RINGERS IV SOLN: INTRAVENOUS | Status: DC

## 2021-12-20 MED ORDER — HALOPERIDOL LACTATE 5 MG/ML IJ SOLN
1.0000 mg | INTRAMUSCULAR | Status: AC
  Administered 2021-12-20: 1 mg via INTRAMUSCULAR
  Filled 2021-12-20: qty 1

## 2021-12-20 MED ORDER — SODIUM CHLORIDE 0.9% FLUSH: 3.0000 mL | INTRAVENOUS | Status: DC | PRN

## 2021-12-20 MED ORDER — HALOPERIDOL 1 MG PO TABS
1.0000 mg | ORAL_TABLET | Freq: Three times a day (TID) | ORAL | Status: DC | PRN
  Filled 2021-12-20 (×2): qty 1

## 2021-12-20 MED ORDER — METOPROLOL TARTRATE 25 MG PO TABS
12.5000 mg | ORAL_TABLET | Freq: Two times a day (BID) | ORAL | Status: DC
  Administered 2021-12-20 – 2021-12-23 (×4): 12.5 mg via ORAL
  Filled 2021-12-20 (×5): qty 1

## 2021-12-20 MED ORDER — SODIUM CHLORIDE 0.9% FLUSH
3.0000 mL | Freq: Two times a day (BID) | INTRAVENOUS | Status: DC
  Administered 2021-12-20 – 2021-12-23 (×4): 3 mL via INTRAVENOUS

## 2021-12-20 MED ORDER — HEPARIN SODIUM (PORCINE) 5000 UNIT/ML IJ SOLN
5000.0000 [IU] | Freq: Three times a day (TID) | INTRAMUSCULAR | Status: DC
  Administered 2021-12-20 – 2021-12-23 (×7): 5000 [IU] via SUBCUTANEOUS
  Filled 2021-12-20 (×7): qty 1

## 2021-12-20 NOTE — ED Notes (Signed)
Pt is clam and cooperative and sleeping at this time. No needs at this time. ?

## 2021-12-20 NOTE — Progress Notes (Signed)
OT Cancellation Note ? ?Patient Details ?Name: SERAPHIM TROW ?MRN: [id number] ?DOB: 11/06/1945 ? ? ?Cancelled Treatment:    Reason Eval/Treat Not Completed: Medical issues which prohibited therapy. OT order received and chart reviewed. Per RN, pt just given Haldol for behaviors. OT checking on pt and he is observed to be thrashing around and kicking out while lying in bed with safety sitter present in the room. Pt is unable to actively participate in OT intervention at this time. OT will re-attempt at next available time.  ? ?Darleen Crocker, MS, OTR/L , CBIS ?ascom [phone]  ?12/20/21, 1:19 PM  ?

## 2021-12-20 NOTE — ED Notes (Signed)
Daughter is feeding pt at this moment. Pt is more calm and happy to see daughter.  Daughter was also informed MRI has not been done at this moment.  ?

## 2021-12-20 NOTE — Progress Notes (Addendum)
PROGRESS NOTE    RIAZ ONORATO  HDQ:[id number] DOB: January 28, 1946 DOA: 12/19/2021 PCP: Glean Hess, MD  Outpatient Specialists: none    Brief Narrative:   Alex Hughes is a 76 y.o. male with medical history significant of HTN and back pain coming for AMS and weakness.     Since Friday decline in ambulation and confusion.  Here for UTI and AKI . AMS and confusion for past few weeks , but falls worse since last few days.  Pt has chronic back issue and his right leg is weakness and has drop foot.  Daughter says he doe not take tylenol but has aleve.    Assessment & Plan:   Principal Problem:   AMS (altered mental status) Active Problems:   Acute liver failure without hepatic coma   Acute kidney injury superimposed on chronic kidney disease (HCC)   UTI (urinary tract infection)   Essential hypertension  # Acute hepatitis LFTs in the 100s, t bili elevated to 4.5. acetaminophen normal. Ruq u/s shows cholelithiasis, otherwise normal. Has AKI as well and is encephalopathic. Ddx is broad. GI is following. Covid/flu neg. Given aki shock liver is possible - MRCP ordered - f/u ethanol level - f/u hepatitis panel - f/u ebv - f/u iron panel - f/u CK - f/u UDS - possible infection as below - f/u GI recs  # Acute encephalopathy No family available for information and no one answers when number in chart called. CT head neg. Possibly 2/2 liver/kidney dysfunction - sitter at bedside - f/u tsh/b12/cortisol - fluids  # Bacteriuria No fever but WBC is elevated and patient is altered - f/u urine culture - cont ceftriaxone - f/u blood cultures, i've ordered them but they will be drawn after initiation of abx  # Acute kidney injury Suspect prerenal from AMS and reduced PO. Is spontaneously voiding - continue fluids - CT stone protocol given signs uti, hematuria  # HTN Here bp wnl - hold home amlodipine, metop for now  DVT prophylaxis: heparin Code Status: full Family  Communication: daughter updated telephonically 3/6  Level of care: Telemetry Medical Status is: Inpatient Remains inpatient appropriate because: severity of illness    Consultants:  GI  Procedures: none  Antimicrobials:  ceftriaxone    Subjective: Denies pain or cough. agitated  Objective: Vitals:   12/20/21 0428 12/20/21 0654 12/20/21 0658 12/20/21 0719  BP:  130/79  128/84  Pulse:   95 86  Resp:    16  Temp:      TempSrc:      SpO2: 95%  95% 94%  Weight:      Height:       No intake or output data in the 24 hours ending 12/20/21 0932 Filed Weights   12/19/21 1734  Weight: 75.3 kg    Examination:  General exam: Appears calm and comfortable  Respiratory system: Clear to auscultation. Respiratory effort normal. Cardiovascular system: S1 & S2 heard, RRR. No JVD, murmurs, rubs, gallops or clicks. No pedal edema. Gastrointestinal system: Abdomen is nondistended, soft and nontender. No organomegaly or masses felt. Normal bowel sounds heard. Central nervous system: oriented to self, knows is in hospital, wrong city, unsure of year. Moving all 4 ext Extremities: Symmetric 5 x 5 power. Skin: No rashes, lesions or ulcers Psychiatry: easily agitates    Data Reviewed: I have personally reviewed following labs and imaging studies  CBC: Recent Labs  Lab 12/19/21 1818  WBC 13.6*  NEUTROABS 12.8*  HGB 15.3  HCT 45.4  MCV 83.8  PLT 979   Basic Metabolic Panel: Recent Labs  Lab 12/19/21 1818  NA 138  K 3.5  CL 101  CO2 24  GLUCOSE 112*  BUN 40*  CREATININE 3.11*  CALCIUM 10.7*   GFR: Estimated Creatinine Clearance: 22.5 mL/min (A) (by C-G formula based on SCr of 3.11 mg/dL (H)). Liver Function Tests: Recent Labs  Lab 12/19/21 1818  AST 365*  ALT 864*  ALKPHOS 138*  BILITOT 4.5*  PROT 6.4*  ALBUMIN 3.5   Recent Labs  Lab 12/19/21 2225  LIPASE 159*   Recent Labs  Lab 12/19/21 2040  AMMONIA 14   Coagulation Profile: No results for  input(s): INR, PROTIME in the last 168 hours. Cardiac Enzymes: No results for input(s): CKTOTAL, CKMB, CKMBINDEX, TROPONINI in the last 168 hours. BNP (last 3 results) No results for input(s): PROBNP in the last 8760 hours. HbA1C: No results for input(s): HGBA1C in the last 72 hours. CBG: No results for input(s): GLUCAP in the last 168 hours. Lipid Profile: No results for input(s): CHOL, HDL, LDLCALC, TRIG, CHOLHDL, LDLDIRECT in the last 72 hours. Thyroid Function Tests: No results for input(s): TSH, T4TOTAL, FREET4, T3FREE, THYROIDAB in the last 72 hours. Anemia Panel: No results for input(s): VITAMINB12, FOLATE, FERRITIN, TIBC, IRON, RETICCTPCT in the last 72 hours. Urine analysis:    Component Value Date/Time   COLORURINE AMBER (A) 12/19/2021 1818   APPEARANCEUR CLOUDY (A) 12/19/2021 1818   LABSPEC 1.019 12/19/2021 1818   PHURINE 5.0 12/19/2021 1818   GLUCOSEU NEGATIVE 12/19/2021 1818   HGBUR MODERATE (A) 12/19/2021 1818   BILIRUBINUR SMALL (A) 12/19/2021 1818   KETONESUR NEGATIVE 12/19/2021 1818   PROTEINUR 100 (A) 12/19/2021 1818   NITRITE NEGATIVE 12/19/2021 1818   LEUKOCYTESUR NEGATIVE 12/19/2021 1818   Sepsis Labs: '@LABRCNTIP'$ (procalcitonin:4,lacticidven:4)  ) Recent Results (from the past 240 hour(s))  Resp Panel by RT-PCR (Flu A&B, Covid) Nasopharyngeal Swab     Status: None   Collection Time: 12/19/21  6:18 PM   Specimen: Nasopharyngeal Swab; Nasopharyngeal(NP) swabs in vial transport medium  Result Value Ref Range Status   SARS Coronavirus 2 by RT PCR NEGATIVE NEGATIVE Final    Comment: (NOTE) SARS-CoV-2 target nucleic acids are NOT DETECTED.  The SARS-CoV-2 RNA is generally detectable in upper respiratory specimens during the acute phase of infection. The lowest concentration of SARS-CoV-2 viral copies this assay can detect is 138 copies/mL. A negative result does not preclude SARS-Cov-2 infection and should not be used as the sole basis for treatment  or other patient management decisions. A negative result may occur with  improper specimen collection/handling, submission of specimen other than nasopharyngeal swab, presence of viral mutation(s) within the areas targeted by this assay, and inadequate number of viral copies(<138 copies/mL). A negative result must be combined with clinical observations, patient history, and epidemiological information. The expected result is Negative.  Fact Sheet for Patients:  EntrepreneurPulse.com.au  Fact Sheet for Healthcare Providers:  IncredibleEmployment.be  This test is no t yet approved or cleared by the [place] FDA and  has been authorized for detection and/or diagnosis of SARS-CoV-2 by FDA under an Emergency Use Authorization (EUA). This EUA will remain  in effect (meaning this test can be used) for the duration of the COVID-19 declaration under Section 564(b)(1) of the Act, 21 U.S.C.section 360bbb-3(b)(1), unless the authorization is terminated  or revoked sooner.       Influenza A by PCR NEGATIVE NEGATIVE Final   Influenza B  by PCR NEGATIVE NEGATIVE Final    Comment: (NOTE) The Xpert Xpress SARS-CoV-2/FLU/RSV plus assay is intended as an aid in the diagnosis of influenza from Nasopharyngeal swab specimens and should not be used as a sole basis for treatment. Nasal washings and aspirates are unacceptable for Xpert Xpress SARS-CoV-2/FLU/RSV testing.  Fact Sheet for Patients: EntrepreneurPulse.com.au  Fact Sheet for Healthcare Providers: IncredibleEmployment.be  This test is not yet approved or cleared by the [place] FDA and has been authorized for detection and/or diagnosis of SARS-CoV-2 by FDA under an Emergency Use Authorization (EUA). This EUA will remain in effect (meaning this test can be used) for the duration of the COVID-19 declaration under Section 564(b)(1) of the Act, 21 U.S.C. section  360bbb-3(b)(1), unless the authorization is terminated or revoked.  Performed at [facility], [address], [place], [place] [address]          Radiology Studies: CT Head Wo Contrast  Result Date: 12/19/2021 CLINICAL DATA:  Balance issues.  Generalized weakness. EXAM: CT HEAD WITHOUT CONTRAST TECHNIQUE: Contiguous axial images were obtained from the base of the skull through the vertex without intravenous contrast. RADIATION DOSE REDUCTION: This exam was performed according to the departmental dose-optimization program which includes automated exposure control, adjustment of the mA and/or kV according to patient size and/or use of iterative reconstruction technique. COMPARISON:  None. FINDINGS: Brain: No intracranial hemorrhage, mass effect, or midline shift. Age related atrophy. No hydrocephalus. The basilar cisterns are patent. No evidence of territorial infarct or acute ischemia. Mild to moderate chronic small vessel ischemic change. No extra-axial or intracranial fluid collection. Vascular: Atherosclerosis of skullbase vasculature without hyperdense vessel or abnormal calcification. Skull: No fracture or focal lesion. Sinuses/Orbits: Paranasal sinuses and mastoid air cells are clear. The visualized orbits are unremarkable. Other: None. IMPRESSION: 1. No acute intracranial abnormality. 2. Age related atrophy and chronic small vessel ischemic change. Electronically Signed   By: Keith Rake M.D.   On: 12/19/2021 19:07   US Abdomen Limited RUQ (LIVER/GB)  Result Date: 12/19/2021 CLINICAL DATA:  Elevated LFTs EXAM: ULTRASOUND ABDOMEN LIMITED RIGHT UPPER QUADRANT COMPARISON:  None. FINDINGS: Gallbladder: Gallbladder is well distended with multiple gallstones. No wall thickening or pericholecystic fluid is noted. Negative sonographic Murphy's sign is elicited. Common bile duct: Diameter: 5 mm Liver: No focal lesion identified. Within normal limits in parenchymal echogenicity. Portal  vein is patent on color Doppler imaging with normal direction of blood flow towards the liver. Other: None. IMPRESSION: Cholelithiasis without complicating factors. Electronically Signed   By: Inez Catalina M.D.   On: 12/19/2021 20:31        Scheduled Meds:  metoprolol tartrate  12.5 mg Oral BID   pantoprazole (PROTONIX) IV  40 mg Intravenous Q12H   sodium chloride flush  3 mL Intravenous Q12H   thiamine  100 mg Oral Daily   Continuous Infusions:  sodium chloride     cefTRIAXone (ROCEPHIN)  IV       LOS: 0 days    Time spent: 50 min    Desma Maxim, MD [facility]   If 7PM-7AM, please contact night-coverage [email or web address] Password [facility] 12/20/2021, 9:32 AM

## 2021-12-20 NOTE — ED Notes (Signed)
Pt ate vanilla pudding and jello.  ?

## 2021-12-20 NOTE — ED Notes (Signed)
Pt was attempting to get oob. Pt stated, "I need to piss," while already urinating. This tech and Wynell Balloon, Estate manager/land agent. Dry brief apply. Mitten adjusted.  ?

## 2021-12-20 NOTE — ED Notes (Addendum)
Pt confused, non-compliant, trying to get oob. Kicking, threatening staff. MRI tech concerned that pt is to confused for mri. [place] tech came to room but unable to do [place] due to pt being non compliant. MD notified ?

## 2021-12-20 NOTE — ED Notes (Signed)
Pt found attempting to get out of bed. Sitting at end of bed and states he has to void. Unable to recall how to use call bell. While getting pt up he sat down onto the floor and had to be lifted up x 2 staff members. Pt assisted to BR with walker. Safety precautions implemented. Light in room on and posey alarm to bed.  ?

## 2021-12-20 NOTE — ED Notes (Signed)
Pt given meds crushed in apple sauce ?

## 2021-12-20 NOTE — ED Notes (Signed)
Pt attemping to get up to use restroom. Bed alarm went of, pt assisted x 2 to BR using walker. Pt unable to follow commands. Rushing due to need to void ?

## 2021-12-20 NOTE — ED Notes (Signed)
Dining service delivered Pt's dinner, Pt states he is not hungry. Will ask again later on. Will continue to monitor.  ?

## 2021-12-20 NOTE — ED Notes (Signed)
Family at bedside. 

## 2021-12-20 NOTE — ED Notes (Signed)
Pt pulled out IV when getting up. Will restart in am.  ?

## 2021-12-20 NOTE — Care Management (Signed)
Cross coverage ? ?Nursing reporting increased confusion overnight, representing a change from baseline.  Sitter ordered ?

## 2021-12-20 NOTE — ED Notes (Signed)
Lunch was offered again. Pt continues to refused. Water was offered. Pt had a few sips of water, after stating, "yes, Im [age]." Pt continues to attempt to remove mittens and trashing around. This tech has attempt to cover pt up with blankets multiple times and continues to remove blankets.  Pt has not attempt to get oob at this moment.  ?

## 2021-12-20 NOTE — ED Notes (Signed)
Iv wrapped with kerlex to try to keep pt from removing - mittens placed bilateral hands for same reason. ?

## 2021-12-20 NOTE — ED Notes (Signed)
Pt attemting to get OOB, when asked if he needs to urinate, Pt states "yes". This tech assisted Pt with using the urinal, Pt had 69m output. Brief noted to be soiled, New brief applied.  ?

## 2021-12-20 NOTE — ED Notes (Signed)
Message sent to [facility]  ?Alex Hughes has become increasing confused overnight. He has pulled out 2 IVs and cannot follow commands. He has attempted to get out of bed every time he needs to void but now he cannot be directed to the commode. He cannot void or follow directions. His bladder doesn't palpate distended. ?

## 2021-12-20 NOTE — ED Notes (Signed)
This tech offered pt lunch. Pt refused after multiple offers, pt said "leave now." This tech will re-attempt w/ lunch.  ?

## 2021-12-20 NOTE — ED Notes (Signed)
Sips of water provided. No other needs found at this moment.   ?

## 2021-12-20 NOTE — ED Notes (Signed)
Pt refused temp and breakfast. Rn aware  ?

## 2021-12-20 NOTE — Consult Note (Addendum)
? ? ?GI Inpatient Consult Note ? ?Reason for Consult: Elevated LFTs, Cholelithiasis  ?  ?Attending Requesting Consult: Dr. Laurey Arrow ? ?History of Present Illness: ?Alex Hughes is a 76 y.o. male seen for evaluation of elevated LFTs at the request of hospitalist - Dr. Laurey Arrow. Pt has a PMH of HTN, back pain, hyperparathyroidism, and MCI. He presented to the [facility] ED yesterday evening for chief complaint of balance issues and confusion for the past two weeks with recurrent falls over the past few days. Upon presentation to the ED, he was mildly tachycardic (HR 102), O2 sats 92% on room air, and otherwise all vital signs within normal limits. Labs were significant for WBC 13.6K, BUN 40, serum creatinine 3.11, eGFR 20, AST 365, ALT 864, alk phos 138, total bilirubin 4.5, GGT 218, troponins neg x2, lipase 159, acetaminophen <51, salicylate level neg. RUQ US showed well-distended gallbladder with multiple gallstones without evidence for gallbladder wall thickening or pericholecystic fluid. CBD was 5 mm in diameter. CT head with no acute intracranial abnormality. GI consulted for further evaluation and management.  ? ?Patient seen and examined this morning resting comfortably in ED stretcher. No family at bedside. Patient reportedly was agitated and confused overnight and a sitter was ordered. He reports he is doing well currently and is able to tell me his age, location, and president of the [place]. He denies any fever, nausea, vomiting, abdominal pain, changes in bowel habits, or rectal bleeding. He denies any known prior hx of liver disease. No known family history of chronic liver disease. He denies any frequent Tylenol use. He does take Aleve occasionally. He denies any illicit drug use. He denies any acute liver-related symptoms such as abdominal swelling, lower extremity edema, or pruritus. He denies any recent medication changes prior to admission to the hospital. He denies any loss of appetite or diet  changes. He lives at home with his wife and children. He is without any other specific complaints.  ? ? ?Past Medical History:  ?Past Medical History:  ?Diagnosis Date  ? FH: heart disease 11/30/2016  ? Hypertension   ?  ?Problem List: ?Patient Active Problem List  ? Diagnosis Date Noted  ? Acute liver failure without hepatic coma 12/20/2021  ? AMS (altered mental status) 12/20/2021  ? Acute kidney injury superimposed on chronic kidney disease ([place]) 12/19/2021  ? UTI (urinary tract infection) 12/19/2021  ? 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 Gout 12/01/2021  ? 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 Primary hyperparathyroidism ([place]) 04/21/2020  ? Vitamin D deficiency 04/21/2020  ? Hypercalcemia 06/20/2018  ? Right foot drop 05/11/2017  ? Right lumbar radiculopathy 11/30/2016  ? Tinnitus 11/30/2016  ? Essential hypertension 11/30/2016  ? Overweight (BMI 25.0-29.9) 11/30/2016  ?  ?Past Surgical History: ?History reviewed. No pertinent surgical history.  ?Allergies: ?Allergies  ?Allergen Reactions  ? Lisinopril Swelling  ?  Facial Swelling ?  ? Elemental Sulfur Other (See Comments)  ?  Urinated blood  ? Sulfa Antibiotics Rash  ?  ?Home Medications: ?(Not in a hospital admission) ? ?Home medication reconciliation was completed with the patient.  ? ?Scheduled Inpatient Medications: ?  ? metoprolol tartrate  12.5 mg Oral BID  ? pantoprazole (PROTONIX) IV  40 mg Intravenous Q12H  ? sodium chloride flush  3 mL Intravenous Q12H  ? thiamine  100 mg Oral Daily  ? ? ?Continuous Inpatient Infusions: ?  ? sodium chloride 20 mL/hr at 12/20/21  3606  ? cefTRIAXone (ROCEPHIN)  IV    ? lactated ringers 50 mL/hr at 12/20/21 0127  ? ? ?PRN Inpatient Medications:  ?sodium chloride flush ? ?Family History: ?family history includes Diabetes in his father; Heart disease in his father.  The patient's family history is negative for inflammatory bowel disorders, GI malignancy, or solid organ  transplantation. ? ?Social History:  ? reports that he has never smoked. He has never used smokeless tobacco. He reports that he does not drink alcohol and does not use drugs. The patient denies ETOH, tobacco, or drug use.  ? ?Review of Systems: ?Constitutional: Weight is stable.  ?Eyes: No changes in vision. ?ENT: No oral lesions, sore throat.  ?GI: see HPI.  ?Heme/Lymph: No easy bruising.  ?CV: No chest pain.  ?GU: No hematuria.  ?Integumentary: No rashes.  ?Neuro: No headaches.  ?Psych: No depression/anxiety.  ?Endocrine: No heat/cold intolerance.  ?Allergic/Immunologic: No urticaria.  ?Resp: No cough, SOB.  ?Musculoskeletal: No joint swelling.  ?  ?Physical Examination: ?BP (!) 141/81   Pulse 81   Temp 98.2 ?F (36.8 ?C) (Oral)   Resp 16   Ht 6' (1.829 m)   Wt 75.3 kg   SpO2 95%   BMI 22.51 kg/m?  ?Gen: NAD, alert and oriented x 4 ?HEENT: PEERLA, EOMI, ?Neck: supple, no JVD or thyromegaly ?Chest: CTA bilaterally, no wheezes, crackles, or other adventitious sounds ?CV: RRR, no m/g/c/r ?Abd: soft, NT, ND, +BS in all four quadrants; no HSM, guarding, ridigity, or rebound tenderness ?Ext: no edema, well perfused with 2+ pulses, ?Skin: no rash or lesions noted ?Lymph: no LAD ? ?Data: ?Lab Results  ?Component Value Date  ? WBC 13.6 (H) 12/19/2021  ? HGB 15.3 12/19/2021  ? HCT 45.4 12/19/2021  ? MCV 83.8 12/19/2021  ? PLT 168 12/19/2021  ? ?Recent Labs  ?Lab 12/19/21 ?1818  ?HGB 15.3  ? ?Lab Results  ?Component Value Date  ? NA 138 12/19/2021  ? K 3.5 12/19/2021  ? CL 101 12/19/2021  ? CO2 24 12/19/2021  ? BUN 40 (H) 12/19/2021  ? CREATININE 3.11 (H) 12/19/2021  ? ?Lab Results  ?Component Value Date  ? ALT 864 (H) 12/19/2021  ? AST 365 (H) 12/19/2021  ? GGT 218 (H) 12/19/2021  ? ALKPHOS 138 (H) 12/19/2021  ? BILITOT 4.5 (H) 12/19/2021  ? ?No results for input(s): APTT, INR, PTT in the last 168 hours. ? ?RUQ [place] 12/19/21 ?-Cholelithiasis without complicating factors. Gallbladder well distended with multiple  gallstones. No wall thickening or pericholecystic fluid noted. Negative sonographic Murphy's sign. CBD 5 mm.  ? ?Assessment/Plan: ? ?76 y/o Caucasian male with a PMH of HTN, MCI, primary hyperparathyroidism, and back pain presented to the [facility] ED yesterday evening for chief complaint of confusion and balance issues found to have AKI and elevated liver enzymes. GI consulted for further evaluation and management.  ? ?Hepatocellular Transaminitis (r Factor 18.8) with AST 365, ALT 864, alk phos 138, total bilirubin 4.5. Differential diagnosis includes ischemic hepatitis, acute viral hepatitis, choledocholithiasis, DILI, infection/sepsis, hyperthyroidism, AIH, etc ? ?Cholelithiasis - Ultrasound showing stones within the fundus without evidence of acute cholecystitis or dilation of the CBD ? ?AKI - possible prerenal 2/2 dehydration, possible sepsis  ? ?Possible UTI - urine culture pending  ?5. Acute encephalopathy ?- ct negative ?- metabolic source ? ?Recommendations: ? ?- Labs and imaging reviewed with patient in room today ?- Check INR and acute hepatitis panel ?- Repeat LFTs with hepatic function panel to differentiate the  bilirubin  ?- Recommend MRCP without contrast to assess biliary tree and rule out CBD stone ?- Avoid hepatotoxic agents  ?- Continue serial abdominal examinations and mental exams ?- Follow-up on urine culture  ?- Full liquids for now ?- Following along with you ?- Further recommendations after labs/imaging  ? ?Thank you for the consult. Please call with questions or concerns. ? ?Geanie Kenning, PA-C ?[facility] ?[phone] ?[phone] (Cell) ? ?

## 2021-12-20 NOTE — Care Management (Signed)
Floor coverage. ?Notified of patient being combative. Haldol 1 mg IV ordered. ?

## 2021-12-20 NOTE — ED Notes (Signed)
Assisted pt with urinal

## 2021-12-20 NOTE — TOC Initial Note (Signed)
Transition of Care (TOC) - Initial/Assessment Note  ? ? ?Patient Details  ?Name: Alex Hughes ?MRN: [id number] ?Date of Birth: 1946/04/25 ? ?Transition of Care (TOC) CM/SW Contact:    ?Shelbie Hutching, RN ?Phone Number: ?12/20/2021, 1:41 PM ? ?Clinical Narrative:                 ?Patient being admitted to the hospital for altered mental status.  RNCM attempted to see patient at the bedside but he is so confused he didn't even recognize my presence very preoccupied with the mitts on his hands.   ?RNCM called and spoke with patient's daughter, Hal Hope.  Candace reports that this is far from the patient's usual mental state.  He normally knows who he is, where he is and what is going on.  He has some mile memory problems at home like getting the remote and telephone confused but he knows names and his surroundings.  At baseline patient is also independent with ADL's, he does not use any assistive devices at home and he still drives although his family feels he shouldn't be.   ?Wife drives and can provide transportation.  ?Patient and wife live in a house in [place], adult daughter also lives with them.  Single story, 3 steps to enter and then no other stairs in the home. ? ?TOC will follow patient progress through hospital, per family he has been very weak and falling in the house.  Anticipated DC to SNF. ? ? Therapy was unable to work with patient this morning due to AMS.   ? ?Expected Discharge Plan: [place] ?Barriers to Discharge: Continued Medical Work up ? ? ?Patient Goals and CMS Choice ?Patient states their goals for this hospitalization and ongoing recovery are:: Family wants to find out ?  ?  ? ?Expected Discharge Plan and Services ?Expected Discharge Plan: [place] ?  ?Discharge Planning Services: CM Consult ?  ?Living arrangements for the past 2 months: [place] ?                ?DME Arranged: N/A ?DME Agency: NA ?  ?  ?  ?  ?  ?  ?  ?  ? ?Prior Living  Arrangements/Services ?Living arrangements for the past 2 months: [place] ?Lives with:: Spouse, Adult Children ?Patient language and need for interpreter reviewed:: Yes ?Do you feel safe going back to the place where you live?: No   patient very confused and weak- far from his normal mental status  ?Need for Family Participation in Patient Care: Yes (Comment) ?Care giver support system in place?: Yes (comment) (wife and 2 daughters) ?  ?Criminal Activity/Legal Involvement Pertinent to Current Situation/Hospitalization: No - Comment as needed ? ?Activities of Daily Living ?Home Assistive Devices/Equipment: Eyeglasses ?ADL Screening (condition at time of admission) ?Patient's cognitive ability adequate to safely complete daily activities?: No ?Is the patient deaf or have difficulty hearing?: No ?Does the patient have difficulty seeing, even when wearing glasses/contacts?: No ?Does the patient have difficulty concentrating, remembering, or making decisions?: Yes ?Patient able to express need for assistance with ADLs?: No ?Does the patient have difficulty dressing or bathing?: Yes ?Independently performs ADLs?: No ?Communication: Independent ?Dressing (OT): Needs assistance ?Is this a change from baseline?: Change from baseline, expected to last <3days ?Grooming: Independent ?Feeding: Independent ?Bathing: Needs assistance ?Is this a change from baseline?: Change from baseline, expected to last <3 days ?Toileting: Needs assistance ?Is this a change from baseline?: Change from  baseline, expected to last <3 days ?In/Out Bed: Independent, Needs assistance ?Is this a change from baseline?: Change from baseline, expected to last <3 days ?Walks in Home: Independent ?Does the patient have difficulty walking or climbing stairs?: Yes ?Weakness of Legs: Both ?Weakness of Arms/Hands: None ? ?Permission Sought/Granted ?Permission sought to share information with : Case Manager, Family Supports ?Permission granted to  share information with : Yes, Verbal Permission Granted ? Share Information with NAME: Candace Cannaday ?   ? Permission granted to share info w Relationship: daughter ? Permission granted to share info w Contact Information: [phone] ? ?Emotional Assessment ?Appearance:: Appears stated age ?Attitude/Demeanor/Rapport: Uncooperative, Inconsistent, Self-Absorbed ?Affect (typically observed): Restless, Agitated ?  ?Alcohol / Substance Use: Not Applicable ?Psych Involvement: No (comment) ? ?Admission diagnosis:  AMS (altered mental status) [R41.82] ?Patient Active Problem List  ? Diagnosis Date Noted  ? Acute liver failure without hepatic coma 12/20/2021  ? AMS (altered mental status) 12/20/2021  ? Acute kidney injury superimposed on chronic kidney disease ([place]) 12/19/2021  ? UTI (urinary tract infection) 12/19/2021  ? 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 Gout 12/01/2021  ? 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 Primary hyperparathyroidism ([place]) 04/21/2020  ? Vitamin D deficiency 04/21/2020  ? Hypercalcemia 06/20/2018  ? Right foot drop 05/11/2017  ? Right lumbar radiculopathy 11/30/2016  ? Tinnitus 11/30/2016  ? Essential hypertension 11/30/2016  ? Overweight (BMI 25.0-29.9) 11/30/2016  ? ?PCP:  Glean Hess, MD ?Pharmacy:   ?[facility], [address] [place] ?[place] [address] ?Phone: [phone] Fax: [phone] ? ?[facility] [place], [place] AT [facility] [address] ?[place] ?[place] [address] ?Phone: [phone] Fax: [phone] ? ? ? ? ?Social Determinants of Health (SDOH) Interventions ?  ? ?Readmission Risk Interventions ?No flowsheet data found. ? ? ?

## 2021-12-20 NOTE — Progress Notes (Signed)
PT Cancellation Note ? ?Patient Details ?Name: Alex Hughes ?MRN: [id number] ?DOB: 1946/07/26 ? ? ?Cancelled Treatment:    Reason Eval/Treat Not Completed: Other (comment). PT order received and chart reviewed. Per RN, pt just given Haldol for behaviors. Per OT checking on pt, she observed him to be thrashing around and kicking out while lying in bed with safety sitter present in the room. Pt is unable to actively participate in PT intervention at this time. PT will re-attempt at next available time.  ? ?Lieutenant Diego PT, DPT ?1:26 PM,12/20/21 ? ?

## 2021-12-20 NOTE — ED Notes (Signed)
Patient is resting comfortably. Patient is more calm at this moment. No other needs found.  ?

## 2021-12-20 NOTE — ED Notes (Signed)
Pt will not leave any monitors on at this time. Easily agitated and combative with directions ?

## 2021-12-20 NOTE — ED Notes (Addendum)
Pt drank 2 cups of apple juice.  ?

## 2021-12-20 NOTE — ED Notes (Signed)
Ultrasound tech at bedside. Pt continues to be calm and following commands. Daughter is at bedside.  ?

## 2021-12-20 NOTE — ED Notes (Signed)
Pericare performed new brief applied. Mittens applied due to pt pulling monitor cables off and preventing pt to pull IV out. This tech sitting w pt at this moment.   ?

## 2021-12-20 NOTE — ED Notes (Addendum)
Pt voided on him self. When this NT and RN tried changing pt he got aggressive and tried to hit and kick. Easily combative and agitated with commands.   ?

## 2021-12-21 ENCOUNTER — Inpatient Hospital Stay: Payer: PPO

## 2021-12-21 DIAGNOSIS — R410 Disorientation, unspecified: Secondary | ICD-10-CM | POA: Diagnosis not present

## 2021-12-21 LAB — COMPREHENSIVE METABOLIC PANEL
ALT: 334 U/L — ABNORMAL HIGH (ref 0–44)
AST: 58 U/L — ABNORMAL HIGH (ref 15–41)
Albumin: 3 g/dL — ABNORMAL LOW (ref 3.5–5.0)
Alkaline Phosphatase: 133 U/L — ABNORMAL HIGH (ref 38–126)
Anion gap: 9 (ref 5–15)
BUN: 31 mg/dL — ABNORMAL HIGH (ref 8–23)
CO2: 24 mmol/L (ref 22–32)
Calcium: 9.6 mg/dL (ref 8.9–10.3)
Chloride: 107 mmol/L (ref 98–111)
Creatinine, Ser: 2.22 mg/dL — ABNORMAL HIGH (ref 0.61–1.24)
GFR, Estimated: 31 mL/min — ABNORMAL LOW (ref >60)
Glucose, Bld: 87 mg/dL (ref 70–99)
Potassium: 3.4 mmol/L — ABNORMAL LOW (ref 3.5–5.1)
Sodium: 140 mmol/L (ref 135–145)
Total Bilirubin: 3.2 mg/dL — ABNORMAL HIGH (ref 0.3–1.2)
Total Protein: 5.8 g/dL — ABNORMAL LOW (ref 6.5–8.1)

## 2021-12-21 LAB — GLUCOSE, CAPILLARY
Glucose-Capillary: 76 mg/dL (ref 70–99)
Glucose-Capillary: 66 mg/dL — ABNORMAL LOW (ref 70–99)
Glucose-Capillary: 115 mg/dL — ABNORMAL HIGH (ref 70–99)

## 2021-12-21 LAB — CBC
HCT: 38.9 % — ABNORMAL LOW (ref 39.0–52.0)
Hemoglobin: 13.1 g/dL (ref 13.0–17.0)
MCH: 27.9 pg (ref 26.0–34.0)
MCHC: 33.7 g/dL (ref 30.0–36.0)
MCV: 82.8 fL (ref 80.0–100.0)
Platelets: 108 10*3/uL — ABNORMAL LOW (ref 150–400)
RBC: 4.7 MIL/uL (ref 4.22–5.81)
RDW: 13.3 % (ref 11.5–15.5)
WBC: 5.8 10*3/uL (ref 4.0–10.5)
nRBC: 0 % (ref 0.0–0.2)

## 2021-12-21 LAB — T3: T3, Total: 42 ng/dL — ABNORMAL LOW (ref 71–180)

## 2021-12-21 LAB — CORTISOL-AM, BLOOD: Cortisol - AM: 22.7 ug/dL — ABNORMAL HIGH (ref 6.7–22.6)

## 2021-12-21 LAB — URINE CULTURE: Culture: NO GROWTH

## 2021-12-21 LAB — MAGNESIUM: Magnesium: 2 mg/dL (ref 1.7–2.4)

## 2021-12-21 MED ORDER — POTASSIUM CHLORIDE IN NACL 20-0.9 MEQ/L-% IV SOLN
INTRAVENOUS | Status: DC
Start: 2021-12-21 — End: 2021-12-22
  Filled 2021-12-21 (×5): qty 1000

## 2021-12-21 NOTE — Progress Notes (Addendum)
PROGRESS NOTE    Alex Hughes  CMK:[id number] DOB: 1946/03/16 DOA: 12/19/2021 PCP: Glean Hess, MD  Outpatient Specialists: none    Brief Narrative:   Alex Hughes is a 76 y.o. male with medical history significant of HTN and back pain coming for AMS and weakness.     Since Friday decline in ambulation and confusion.  Here for UTI and AKI . AMS and confusion for past few weeks , but falls worse since last few days.  Pt has chronic back issue and his right leg is weakness and has drop foot.  Daughter says he doe not take tylenol but has aleve.    Assessment & Plan:   Principal Problem:   AMS (altered mental status) Active Problems:   Acute liver failure without hepatic coma   Acute kidney injury superimposed on chronic kidney disease (HCC)   UTI (urinary tract infection)   Essential hypertension   Pressure injury of skin  # Acute hepatitis # Thrombocytopenia LFTs in the 100s, t bili elevated to 4.5. acetaminophen normal. Ruq u/s shows cholelithiasis, otherwise normal. Has AKI as well and is encephalopathic. LFTs improving with fluids so at this point liver ischemia most likely etiology. Hepatitis panel neg, iron panel neg, uds neg. Doesn't appear to have infection - MRCP ordered - GI following - cont fluids - further w/u thrombocytopenia if continues to decline  # Acute encephalopathy Remains altered but appears to be improving.Possibly 2/2 liver/kidney dysfunction. No significant thyroid function derangement. B12 wnl. Cortisol pending. CT head nothing acute - sitter at bedside - will attempt MRI today - daily ekg for qtc monitoring as requiring occasional haldol  # Bacteriuria Culture is negative - stop abx - f/u blood cultures, ngtd  # Acute kidney injury Suspect prerenal from AMS and reduced PO. Is spontaneously voiding. Improving with fluids. Renal u/s without signs obstruction - continue fluids  # HTN Here bp wnl - hold home amlodipine, metop for  now  # Low TSH Mildly low, t3/t4 not elevated - outpt f/u  DVT prophylaxis: heparin Code Status: full Family Communication: daughter updated telephonically 3/6  Level of care: Telemetry Medical Status is: Inpatient Remains inpatient appropriate because: severity of illness    Consultants:  GI  Procedures: none  Antimicrobials:  S/p ceftriaxone    Subjective: Denies pain or cough. Calm this morning.   Objective: Vitals:   12/20/21 1831 12/20/21 1900 12/21/21 0425 12/21/21 0826  BP: (!) 171/86 (!) 108/58 111/63 109/64  Pulse: 89 80 (!) 58 (!) 57  Resp: '17 17 18 18  '$ Temp: 100.2 F (37.9 C) 97.8 F (36.6 C)  (!) 96.7 F (35.9 C)  TempSrc: Oral Oral    SpO2: 100% 100% 97%   Weight:      Height:        Intake/Output Summary (Last 24 hours) at 12/21/2021 0949 Last data filed at 12/21/2021 0606 Gross per 24 hour  Intake 1100 ml  Output 700 ml  Net 400 ml   Filed Weights   12/19/21 1734  Weight: 75.3 kg    Examination:  General exam: Appears calm and comfortable  Respiratory system: Clear to auscultation. Respiratory effort normal. Cardiovascular system: S1 & S2 heard, RRR. No JVD, murmurs, rubs, gallops or clicks. No pedal edema. Gastrointestinal system: Abdomen is nondistended, soft and nontender. No organomegaly or masses felt. Normal bowel sounds heard. Central nervous system: oriented to self, knows is in hospitalMoving all 4 ext Extremities: Symmetric 5 x 5 power. Skin: No  rashes, lesions or ulcers Psychiatry: calm    Data Reviewed: I have personally reviewed following labs and imaging studies  CBC: Recent Labs  Lab 12/19/21 1818 12/21/21 0351  WBC 13.6* 5.8  NEUTROABS 12.8*  --   HGB 15.3 13.1  HCT 45.4 38.9*  MCV 83.8 82.8  PLT 168 397*   Basic Metabolic Panel: Recent Labs  Lab 12/19/21 1818 12/20/21 1107 12/21/21 0351  NA 138 139 140  K 3.5 4.0 3.4*  CL 101 106 107  CO2 '24 26 24  '$ GLUCOSE 112* 91 87  BUN 40* 36* 31*   CREATININE 3.11* 2.71* 2.22*  CALCIUM 10.7* 10.0 9.6  MG  --   --  2.0   GFR: Estimated Creatinine Clearance: 31.6 mL/min (A) (by C-G formula based on SCr of 2.22 mg/dL (H)). Liver Function Tests: Recent Labs  Lab 12/19/21 1818 12/20/21 1107 12/21/21 0351  AST 365* 121* 58*  ALT 864* 554* 334*  ALKPHOS 138* 142* 133*  BILITOT 4.5* 3.4* 3.2*  PROT 6.4* 6.1* 5.8*  ALBUMIN 3.5 3.2* 3.0*   Recent Labs  Lab 12/19/21 2225  LIPASE 159*   Recent Labs  Lab 12/19/21 2040  AMMONIA 14   Coagulation Profile: Recent Labs  Lab 12/20/21 1107  INR 1.2   Cardiac Enzymes: Recent Labs  Lab 12/20/21 1108  CKTOTAL 51   BNP (last 3 results) No results for input(s): PROBNP in the last 8760 hours. HbA1C: No results for input(s): HGBA1C in the last 72 hours. CBG: Recent Labs  Lab 12/21/21 0820  GLUCAP 76   Lipid Profile: No results for input(s): CHOL, HDL, LDLCALC, TRIG, CHOLHDL, LDLDIRECT in the last 72 hours. Thyroid Function Tests: Recent Labs    12/20/21 1107 12/20/21 1108  TSH 0.269*  --   FREET4  --  1.01   Anemia Panel: Recent Labs    12/20/21 1108  VITAMINB12 1,005*  FERRITIN 1,919*  TIBC 224*  IRON 23*   Urine analysis:    Component Value Date/Time   COLORURINE AMBER (A) 12/19/2021 1818   APPEARANCEUR CLOUDY (A) 12/19/2021 1818   LABSPEC 1.019 12/19/2021 1818   PHURINE 5.0 12/19/2021 1818   GLUCOSEU NEGATIVE 12/19/2021 1818   HGBUR MODERATE (A) 12/19/2021 1818   BILIRUBINUR SMALL (A) 12/19/2021 1818   KETONESUR NEGATIVE 12/19/2021 1818   PROTEINUR 100 (A) 12/19/2021 1818   NITRITE NEGATIVE 12/19/2021 1818   LEUKOCYTESUR NEGATIVE 12/19/2021 1818   Sepsis Labs: '@LABRCNTIP'$ (procalcitonin:4,lacticidven:4)  ) Recent Results (from the past 240 hour(s))  Resp Panel by RT-PCR (Flu A&B, Covid) Nasopharyngeal Swab     Status: None   Collection Time: 12/19/21  6:18 PM   Specimen: Nasopharyngeal Swab; Nasopharyngeal(NP) swabs in vial transport medium   Result Value Ref Range Status   SARS Coronavirus 2 by RT PCR NEGATIVE NEGATIVE Final    Comment: (NOTE) SARS-CoV-2 target nucleic acids are NOT DETECTED.  The SARS-CoV-2 RNA is generally detectable in upper respiratory specimens during the acute phase of infection. The lowest concentration of SARS-CoV-2 viral copies this assay can detect is 138 copies/mL. A negative result does not preclude SARS-Cov-2 infection and should not be used as the sole basis for treatment or other patient management decisions. A negative result may occur with  improper specimen collection/handling, submission of specimen other than nasopharyngeal swab, presence of viral mutation(s) within the areas targeted by this assay, and inadequate number of viral copies(<138 copies/mL). A negative result must be combined with clinical observations, patient history, and epidemiological information. The expected  result is Negative.  Fact Sheet for Patients:  EntrepreneurPulse.com.au  Fact Sheet for Healthcare Providers:  IncredibleEmployment.be  This test is no t yet approved or cleared by the [place] FDA and  has been authorized for detection and/or diagnosis of SARS-CoV-2 by FDA under an Emergency Use Authorization (EUA). This EUA will remain  in effect (meaning this test can be used) for the duration of the COVID-19 declaration under Section 564(b)(1) of the Act, 21 U.S.C.section 360bbb-3(b)(1), unless the authorization is terminated  or revoked sooner.       Influenza A by PCR NEGATIVE NEGATIVE Final   Influenza B by PCR NEGATIVE NEGATIVE Final    Comment: (NOTE) The Xpert Xpress SARS-CoV-2/FLU/RSV plus assay is intended as an aid in the diagnosis of influenza from Nasopharyngeal swab specimens and should not be used as a sole basis for treatment. Nasal washings and aspirates are unacceptable for Xpert Xpress SARS-CoV-2/FLU/RSV testing.  Fact Sheet for  Patients: EntrepreneurPulse.com.au  Fact Sheet for Healthcare Providers: IncredibleEmployment.be  This test is not yet approved or cleared by the [place] FDA and has been authorized for detection and/or diagnosis of SARS-CoV-2 by FDA under an Emergency Use Authorization (EUA). This EUA will remain in effect (meaning this test can be used) for the duration of the COVID-19 declaration under Section 564(b)(1) of the Act, 21 U.S.C. section 360bbb-3(b)(1), unless the authorization is terminated or revoked.  Performed at [facility], [address], [place], [place] [address]   Urine Culture     Status: None   Collection Time: 12/19/21  6:27 PM   Specimen: Urine, Clean Catch  Result Value Ref Range Status   Specimen Description   Final    URINE, CLEAN CATCH Performed at [facility], [address], [place], [place] [address]    Special Requests   Final    NONE Performed at [facility], [address] [place], [place] [address]    Culture   Final    NO GROWTH Performed at [facility], [place] [address], [place], [place] [address]    Report Status 12/21/2021 FINAL  Final  CULTURE, BLOOD (ROUTINE X 2) w Reflex to ID Panel     Status: None (Preliminary result)   Collection Time: 12/20/21 11:08 AM   Specimen: BLOOD  Result Value Ref Range Status   Specimen Description BLOOD RIGHT FA  Final   Special Requests   Final    BOTTLES DRAWN AEROBIC AND ANAEROBIC Blood Culture adequate volume   Culture   Final    NO GROWTH < 24 HOURS Performed at [facility], [address] [place], [place] [address]    Report Status PENDING  Incomplete  Culture, blood (Routine X 2) w Reflex to ID Panel     Status: None (Preliminary result)   Collection Time: 12/20/21 11:38 PM   Specimen: BLOOD  Result Value Ref Range Status   Specimen Description BLOOD LEFT ASSIST CONTROL  Final   Special Requests   Final     BOTTLES DRAWN AEROBIC AND ANAEROBIC Blood Culture results may not be optimal due to an excessive volume of blood received in culture bottles   Culture   Final    NO GROWTH < 12 HOURS Performed at [facility], [address], [place], [place] [address]    Report Status PENDING  Incomplete         Radiology Studies: CT Head Wo Contrast  Result Date: 12/19/2021 CLINICAL DATA:  Balance issues.  Generalized weakness. EXAM: CT HEAD WITHOUT CONTRAST TECHNIQUE: Contiguous axial images were obtained from the base of the skull through the vertex without intravenous contrast. RADIATION DOSE REDUCTION: This exam was performed according to the departmental dose-optimization program which includes automated exposure control, adjustment of the mA and/or kV according to patient size and/or use of iterative reconstruction technique. COMPARISON:  None. FINDINGS: Brain: No intracranial hemorrhage, mass effect, or midline shift. Age related atrophy. No hydrocephalus. The basilar cisterns are patent. No evidence of territorial infarct or acute ischemia. Mild to moderate chronic small vessel ischemic change. No extra-axial or intracranial fluid collection. Vascular: Atherosclerosis of skullbase vasculature without hyperdense vessel or abnormal calcification. Skull: No fracture or focal lesion. Sinuses/Orbits: Paranasal sinuses and mastoid air cells are clear. The visualized orbits are unremarkable. Other: None. IMPRESSION: 1. No acute intracranial abnormality. 2. Age related atrophy and chronic small vessel ischemic change. Electronically Signed   By: Keith Rake M.D.   On: 12/19/2021 19:07   US RENAL  Result Date: 12/20/2021 CLINICAL DATA:  A KI EXAM: RENAL / URINARY TRACT ULTRASOUND COMPLETE COMPARISON:  CT Feb 17, 2015. FINDINGS: Right Kidney: Renal measurements: 12.9 x 6.8 x 5.0 cm = volume: 229 mL. Echogenicity within normal limits. No hydronephrosis. Simple appearing cysts for within the upper and  midpole, measuring 3.6 x3.6 cm and 1.1 x 0.9 cm respectively. Shadowing calculus measuring 1.3 cm. Left Kidney: Renal measurements: 11.2 x 5.9 x 6.7 cm = volume: 230 mL. Echogenicity within normal limits. No hydronephrosis. Simple appearing cyst, measuring 1.7 x 1.6 cm. Shadowing calculus, measuring 1.3 cm. Bladder: Appears normal for degree of bladder distention. 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 This mass measures approximately 7.4 x 7.5 x 7.5 cm, not substantially changed when comparing across modalities. IMPRESSION: 1. No hydronephrosis. 2. Bilateral nephrolithiasis and renal cysts. 3. 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 This mass measures approximately 7.5 cm, not substantially changed when comparing across modalities. Electronically Signed   By: Margaretha Sheffield M.D.   On: 12/20/2021 15:41   DG Chest Port 1 View  Result Date: 12/20/2021 CLINICAL DATA:  Altered mental status EXAM: PORTABLE CHEST 1 VIEW COMPARISON:  None. FINDINGS: Low lung volumes. Top-normal heart size. Normal mediastinal contour. No pneumothorax. No pleural effusion. No overt pulmonary edema. Mild hazy right lung base opacity. IMPRESSION: Low lung volumes. Mild hazy right lung base opacity, favor atelectasis. Electronically Signed   By: Ilona Sorrel M.D.   On: 12/20/2021 16:51   US Abdomen Limited RUQ (LIVER/GB)  Result Date: 12/19/2021 CLINICAL DATA:  Elevated LFTs EXAM: ULTRASOUND ABDOMEN LIMITED RIGHT UPPER QUADRANT COMPARISON:  None. FINDINGS: Gallbladder: Gallbladder is well distended with multiple gallstones. No wall thickening or pericholecystic fluid is noted. Negative sonographic Murphy's sign is elicited. Common bile duct: Diameter: 5 mm Liver: No focal lesion identified. Within normal limits in parenchymal echogenicity. Portal vein is patent on color  Doppler imaging with normal direction of blood flow towards the liver. Other: None. IMPRESSION: Cholelithiasis without complicating factors. Electronically Signed   By: Inez Catalina M.D.   On: 12/19/2021 20:31        Scheduled Meds:  heparin  5,000 Units Subcutaneous Q8H   metoprolol tartrate  12.5 mg Oral BID   sodium chloride flush  3 mL Intravenous Q12H   thiamine  100 mg Oral Daily   Continuous Infusions:  sodium chloride 150 mL/hr at 12/20/21 1641   cefTRIAXone (ROCEPHIN)  IV Stopped (12/20/21 2300)     LOS: 1 day    Time spent: 30 min    Desma Maxim, MD [facility]   If 7PM-7AM, please contact night-coverage [email or web address] Password [facility] 12/21/2021, 9:49 AM

## 2021-12-21 NOTE — Evaluation (Signed)
Occupational Therapy Evaluation ?Patient Details ?Name: Alex Hughes ?MRN: [id number] ?DOB: 16-Dec-1945 ?Today's Date: 12/21/2021 ? ? ?History of Present Illness Alex Hughes is a 76 y.o. male with medical history significant of HTN and back pain. Pt admitted with UTI and AKI.  ? ?Clinical Impression ?  ?Patient presenting with decreased Ind in self care, balance, functional mobility/transfers, endurance, and safety awareness. Patient is poor historian and no family present this session. Per chart review, pt has reported to staff that pt lives with wife and daughter and he is independent in self care tasks at baseline. He does not use AD for ambulation and has some mild memory deficits. Pt currently appears confused but pleasant and agreeable to OT intervention. Pt does need increased time to process and min cuing for safety awareness.  Patient currently functioning at [facility] for balance with functional mobility. He was able to sequence grooming tasks in standing with increased time. Patient will benefit from acute OT to increase overall independence in the areas of ADLs, functional mobility, and safety awareness in order to safely discharge home with family. ?   ? ?Recommendations for follow up therapy are one component of a multi-disciplinary discharge planning process, led by the attending physician.  Recommendations may be updated based on patient status, additional functional criteria and insurance authorization.  ? ?Follow Up Recommendations ? Home health OT  ?  ?Assistance Recommended at Discharge Frequent or constant Supervision/Assistance  ?Patient can return home with the following A little help with walking and/or transfers;A little help with bathing/dressing/bathroom;Assistance with cooking/housework;Direct supervision/assist for medications management;Direct supervision/assist for financial management;Assist for transportation;Help with stairs or ramp for entrance ? ?  ?Functional Status Assessment ? Patient  has had a recent decline in their functional status and demonstrates the ability to make significant improvements in function in a reasonable and predictable amount of time.  ?Equipment Recommendations ? Other (comment) (RW)  ?  ?   ?Precautions / Restrictions Precautions ?Precautions: Fall  ? ?  ? ?Mobility Bed Mobility ?Overal bed mobility: Needs Assistance ?Bed Mobility: Supine to Sit, Sit to Supine ?  ?  ?Supine to sit: Supervision ?Sit to supine: Supervision ?  ?General bed mobility comments: no physical assistance with min cuing for safety awareness ?  ? ?Transfers ?Overall transfer level: Needs assistance ?Equipment used: 1 person hand held assist ?Transfers: Sit to/from Stand ?Sit to Stand: Min guard ?  ?  ?  ?  ?  ?  ?  ? ?  ?Balance Overall balance assessment: Needs assistance ?Sitting-balance support: Feet supported, No upper extremity supported ?Sitting balance-Leahy Scale: Good ?  ?  ?Standing balance support: During functional activity, Reliant on assistive device for balance ?Standing balance-Leahy Scale: Fair ?  ?  ?  ?  ?  ?  ?  ?  ?  ?  ?  ?  ?   ? ?ADL either performed or assessed with clinical judgement  ? ?ADL Overall ADL's : Needs assistance/impaired ?  ?  ?Grooming: Wash/dry hands;Wash/dry face;Oral care;Sitting;Min guard ?  ?  ?  ?  ?  ?  ?  ?  ?  ?  ?  ?  ?  ?  ?  ?Functional mobility during ADLs: Min guard ?   ? ? ? ?Vision Patient Visual Report: No change from baseline ?   ?   ?   ?   ? ?Pertinent Vitals/Pain Pain Assessment ?Pain Assessment: No/denies pain  ? ? ? ?Hand Dominance Right ?  ?  Extremity/Trunk Assessment Upper Extremity Assessment ?Upper Extremity Assessment: Overall WFL for tasks assessed;Generalized weakness ?  ?Lower Extremity Assessment ?Lower Extremity Assessment: Overall WFL for tasks assessed;Generalized weakness ?  ?  ?  ?Communication Communication ?Communication: No difficulties ?  ?Cognition Arousal/Alertness: Awake/alert ?Behavior During Therapy: Impulsive ?Overall  Cognitive Status: No family/caregiver present to determine baseline cognitive functioning ?  ?  ?  ?  ?  ?  ?  ?  ?  ?  ?  ?  ?  ?  ?  ?  ?General Comments: Pt oriented to self, location, and month. Per chart review, family reports pt with baseline memory deficits. ?  ?  ?   ?   ?   ? ? ?Home Living Family/patient expects to be discharged to:: Private residence ?Living Arrangements: Spouse/significant other;Children (wife and daughter) ?Available Help at Discharge: Family;Available 24 hours/day ?Type of Home: House ?Home Access: Stairs to enter ?Entrance Stairs-Number of Steps: 3 ?  ?Home Layout: One level ?  ?  ?  ?  ?  ?  ?  ?  ?  ?Additional Comments: no family to provide home set up info in room on evaluation. Information obtained from staff report after talking with family. ?  ? ?  ?Prior Functioning/Environment Prior Level of Function : Independent/Modified Independent ?  ?  ?  ?  ?  ?  ?Mobility Comments: Pt ambulates without use of AD and drives. ?ADLs Comments: Pt is independent in ADLs ?  ? ?  ?  ?OT Problem List: Decreased strength;Decreased range of motion;Decreased activity tolerance;Decreased safety awareness;Impaired balance (sitting and/or standing);Decreased cognition;Decreased knowledge of use of DME or AE ?  ?   ?OT Treatment/Interventions: Self-care/ADL training;Manual therapy;Therapeutic exercise;Modalities;Patient/family education;Balance training;Energy conservation;Therapeutic activities;DME and/or AE instruction;Cognitive remediation/compensation  ?  ?OT Goals(Current goals can be found in the care plan section) Acute Rehab OT Goals ?Patient Stated Goal: to go home ?OT Goal Formulation: With patient ?Time For Goal Achievement: 01/04/22 ?Potential to Achieve Goals: Good  ?OT Frequency: Min 2X/week ?  ? ?   ?AM-PAC OT "6 Clicks" Daily Activity     ?Outcome Measure Help from another person eating meals?: None ?Help from another person taking care of personal grooming?: A Little ?Help from  another person toileting, which includes using toliet, bedpan, or urinal?: A Little ?Help from another person bathing (including washing, rinsing, drying)?: A Little ?Help from another person to put on and taking off regular upper body clothing?: None ?Help from another person to put on and taking off regular lower body clothing?: A Little ?6 Click Score: 20 ?  ?End of Session Equipment Utilized During Treatment: Other (comment) (IV pole) ?Nurse Communication: Other (comment);Mobility status (IV complete) ? ?Activity Tolerance: Patient tolerated treatment well ?Patient left: in bed;with call bell/phone within reach;with bed alarm set;with nursing/sitter in room;Other (comment) (B hand mitts donned) ? ?OT Visit Diagnosis: Unsteadiness on feet (R26.81);Muscle weakness (generalized) (M62.81)  ?              ?Time: 2952-8413 ?OT Time Calculation (min): 17 min ?Charges:  OT General Charges ?$OT Visit: 1 Visit ?OT Evaluation ?$OT Eval Moderate Complexity: 1 Mod ?OT Treatments ?$Self Care/Home Management : 8-22 mins ? ?Darleen Crocker, MS, OTR/L , CBIS ?ascom [phone]  ?12/21/21, 1:30 PM  ?

## 2021-12-21 NOTE — Progress Notes (Signed)
Mri mrcp reviewed. No choledocholithiasis or biliary dilation. Gb mildly distended. No wall thickening or pericholecystic fluid.  ? ?Can consider hida scan and if positive, consult general surgery. Lfts continue to downtrend. More suspicious of hepatic ischemia, but passed stone a possibility given total Bili and alt>ast.  ?No further intervention from GI planned at this time.  ?GI to sign off. Available as needed.  ? ?[place], DO ?[facility]

## 2021-12-21 NOTE — Evaluation (Signed)
Physical Therapy Evaluation ?Patient Details ?Name: Alex Hughes ?MRN: [id number] ?DOB: 1946/07/11 ?Today's Date: 12/21/2021 ? ?History of Present Illness ? Alex Hughes is a 77 y.o. male with medical history significant of HTN and back pain. Pt admitted with UTI and AKI.  ?Clinical Impression ? Pt awake and resting in bed w/ sitter at bedside, upon PT entrance into room for evaluation today. He is oriented to himself, but unable to recall location or year. PLOF/home-set up was obtained from chart and staff report with family prior; does not use an AD at baseline.  ? ?Pt is able to perform all bed mobility w/ SUPERVISION and can sit EOB w/ SUPERVISION. He is able to perform sit to stand w/ CGA using RW; required verbal cues for proper UE placement for optimal utilization and safety. He is able to ambulate ~139f w/ CGA and RW; no c/o negative symptoms throughout. Pt will benefit from continued skilled PT in order to increase LE strength/endurance, improve mobility/gait, and restore PLOF. Current discharge recommendation to HHPT is appropriate due to the level of assistance required by the patient to ensure safety and improve overall function. ? ?   ? ?Recommendations for follow up therapy are one component of a multi-disciplinary discharge planning process, led by the attending physician.  Recommendations may be updated based on patient status, additional functional criteria and insurance authorization. ? ?Follow Up Recommendations Home health PT ? ?  ?Assistance Recommended at Discharge Frequent or constant Supervision/Assistance  ?Patient can return home with the following ? A little help with walking and/or transfers;A little help with bathing/dressing/bathroom;Assistance with cooking/housework;Assist for transportation;Help with stairs or ramp for entrance ? ?  ?Equipment Recommendations Rolling walker (2 wheels)  ?Recommendations for Other Services ?    ?  ?Functional Status Assessment Patient has had a recent  decline in their functional status and demonstrates the ability to make significant improvements in function in a reasonable and predictable amount of time.  ? ?  ?Precautions / Restrictions Precautions ?Precautions: Fall  ? ?  ? ?Mobility ? Bed Mobility ?Overal bed mobility: Needs Assistance ?Bed Mobility: Supine to Sit, Sit to Supine ?  ?  ?Supine to sit: Supervision ?Sit to supine: Supervision ?  ?General bed mobility comments: no physical assistance with min cuing for safety awareness ?  ? ?Transfers ?Overall transfer level: Needs assistance ?Equipment used: Rolling walker (2 wheels) ?Transfers: Sit to/from Stand ?Sit to Stand: Min guard ?  ?  ?  ?  ?  ?  ?  ? ?Ambulation/Gait ?Ambulation/Gait assistance: Min guard ?Gait Distance (Feet): 100 Feet ?Assistive device: Rolling walker (2 wheels) ?Gait Pattern/deviations: Step-through pattern, Decreased step length - right, Decreased step length - left, Decreased stride length ?Gait velocity: decreased ?  ?  ?  ? ?Stairs ?  ?  ?  ?  ?  ? ?Wheelchair Mobility ?  ? ?Modified Rankin (Stroke Patients Only) ?  ? ?  ? ?Balance Overall balance assessment: Needs assistance ?Sitting-balance support: Feet supported, No upper extremity supported ?Sitting balance-Leahy Scale: Good ?  ?  ?Standing balance support: During functional activity, Reliant on assistive device for balance ?Standing balance-Leahy Scale: Fair ?  ?  ?  ?  ?  ?  ?  ?  ?  ?  ?  ?  ?   ? ? ? ?Pertinent Vitals/Pain Pain Assessment ?Pain Assessment: No/denies pain  ? ? ?Home Living Family/patient expects to be discharged to:: Private residence ?Living Arrangements: Spouse/significant other;Children ?Available Help  at Discharge: Family;Available 24 hours/day ?Type of Home: House ?Home Access: Stairs to enter ?  ?Entrance Stairs-Number of Steps: 3 ?  ?Home Layout: One level ?  ?Additional Comments: no family to provide home set up info in room on evaluation. Information obtained from staff report after talking with  family.  ?  ?Prior Function Prior Level of Function : Independent/Modified Independent ?  ?  ?  ?  ?  ?  ?Mobility Comments: Pt ambulates without use of AD and drives. ?ADLs Comments: Pt is independent in ADLs ?  ? ? ?Hand Dominance  ? Dominant Hand: Right ? ?  ?Extremity/Trunk Assessment  ? Upper Extremity Assessment ?Upper Extremity Assessment: Overall WFL for tasks assessed;Generalized weakness ?  ? ?Lower Extremity Assessment ?Lower Extremity Assessment: Overall WFL for tasks assessed;Generalized weakness ?  ? ?   ?Communication  ? Communication: No difficulties  ?Cognition Arousal/Alertness: Awake/alert ?Behavior During Therapy: [facility] for tasks assessed/performed ?Overall Cognitive Status: No family/caregiver present to determine baseline cognitive functioning ?  ?  ?  ?  ?  ?  ?  ?  ?  ?  ?  ?  ?  ?  ?  ?  ?General Comments: Pt oriented to self; unable to recall location or year. Per chart review, family reports pt w/ baseline memory deficits ?  ?  ? ?  ?General Comments   ? ?  ?Exercises    ? ?Assessment/Plan  ?  ?PT Assessment Patient needs continued PT services  ?PT Problem List Decreased strength;Decreased safety awareness;Decreased knowledge of use of DME;Decreased cognition;Decreased activity tolerance;Decreased balance;Decreased coordination;Decreased mobility ? ?   ?  ?PT Treatment Interventions DME instruction;Therapeutic exercise;Gait training;Balance training;Stair training;Neuromuscular re-education;Cognitive remediation;Therapeutic activities;Functional mobility training   ? ?PT Goals (Current goals can be found in the Care Plan section)  ?Acute Rehab PT Goals ?Patient Stated Goal: to improve symptoms to go home ?PT Goal Formulation: With patient ?Time For Goal Achievement: 01/04/22 ?Potential to Achieve Goals: Fair ? ?  ?Frequency Min 2X/week ?  ? ? ?Co-evaluation   ?  ?  ?  ?  ? ? ?  ?AM-PAC PT "6 Clicks" Mobility  ?Outcome Measure Help needed turning from your back to your side while in a flat  bed without using bedrails?: A Little ?Help needed moving from lying on your back to sitting on the side of a flat bed without using bedrails?: A Little ?Help needed moving to and from a bed to a chair (including a wheelchair)?: A Little ?Help needed standing up from a chair using your arms (e.g., wheelchair or bedside chair)?: A Little ?Help needed to walk in hospital room?: A Little ?Help needed climbing 3-5 steps with a railing? : A Lot ?6 Click Score: 17 ? ?  ?End of Session Equipment Utilized During Treatment: Gait belt ?Activity Tolerance: Patient tolerated treatment well ?Patient left: in bed;with call bell/phone within reach;with bed alarm set ?Nurse Communication: Mobility status ?PT Visit Diagnosis: Unsteadiness on feet (R26.81);History of falling (Z91.81);Muscle weakness (generalized) (M62.81) ?  ? ?Time: 3009-2330 ?PT Time Calculation (min) (ACUTE ONLY): 20 min ? ? ?Charges:     ?  ?  ?   ? ?Jonnie Kind, SPT ?12/21/2021, 11:46 AM ? ?

## 2021-12-21 NOTE — Progress Notes (Signed)
Hypoglycemic Event ? ?CBG: 66 ? ?Treatment: po carbs ? ?Symptoms: Hungry ? ?Follow-up CBG: Time:40 CBG Result:115 ? ?Possible Reasons for Event: Inadequate meal intake. Patient had been NPO all day ? ?Comments/MD notified:Girguis MD notified. BG checks changed from daily to Q4. ? ? ? ?[place] ? ? ?

## 2021-12-22 DIAGNOSIS — G9341 Metabolic encephalopathy: Secondary | ICD-10-CM

## 2021-12-22 DIAGNOSIS — D696 Thrombocytopenia, unspecified: Secondary | ICD-10-CM | POA: Diagnosis present

## 2021-12-22 DIAGNOSIS — R7989 Other specified abnormal findings of blood chemistry: Secondary | ICD-10-CM | POA: Diagnosis present

## 2021-12-22 LAB — CBC
HCT: 36.1 % — ABNORMAL LOW (ref 39.0–52.0)
Hemoglobin: 12.1 g/dL — ABNORMAL LOW (ref 13.0–17.0)
MCH: 27.4 pg (ref 26.0–34.0)
MCHC: 33.5 g/dL (ref 30.0–36.0)
MCV: 81.9 fL (ref 80.0–100.0)
Platelets: 113 10*3/uL — ABNORMAL LOW (ref 150–400)
RBC: 4.41 MIL/uL (ref 4.22–5.81)
RDW: 13.6 % (ref 11.5–15.5)
WBC: 4.7 10*3/uL (ref 4.0–10.5)
nRBC: 0 % (ref 0.0–0.2)

## 2021-12-22 LAB — COMPREHENSIVE METABOLIC PANEL
ALT: 204 U/L — ABNORMAL HIGH (ref 0–44)
AST: 32 U/L (ref 15–41)
Albumin: 2.7 g/dL — ABNORMAL LOW (ref 3.5–5.0)
Alkaline Phosphatase: 136 U/L — ABNORMAL HIGH (ref 38–126)
Anion gap: 6 (ref 5–15)
BUN: 29 mg/dL — ABNORMAL HIGH (ref 8–23)
CO2: 19 mmol/L — ABNORMAL LOW (ref 22–32)
Calcium: 9.4 mg/dL (ref 8.9–10.3)
Chloride: 114 mmol/L — ABNORMAL HIGH (ref 98–111)
Creatinine, Ser: 1.64 mg/dL — ABNORMAL HIGH (ref 0.61–1.24)
GFR, Estimated: 44 mL/min — ABNORMAL LOW (ref >60)
Glucose, Bld: 92 mg/dL (ref 70–99)
Potassium: 3.4 mmol/L — ABNORMAL LOW (ref 3.5–5.1)
Sodium: 139 mmol/L (ref 135–145)
Total Bilirubin: 2 mg/dL — ABNORMAL HIGH (ref 0.3–1.2)
Total Protein: 5.2 g/dL — ABNORMAL LOW (ref 6.5–8.1)

## 2021-12-22 LAB — LIPASE, BLOOD: Lipase: 92 U/L — ABNORMAL HIGH (ref 11–51)

## 2021-12-22 LAB — GLUCOSE, CAPILLARY
Glucose-Capillary: 81 mg/dL (ref 70–99)
Glucose-Capillary: 120 mg/dL — ABNORMAL HIGH (ref 70–99)

## 2021-12-22 MED ORDER — POTASSIUM CHLORIDE 2 MEQ/ML IV SOLN
INTRAVENOUS | Status: DC
  Filled 2021-12-22 (×5): qty 1000

## 2021-12-22 MED ORDER — POTASSIUM CHLORIDE CRYS ER 20 MEQ PO TBCR
40.0000 meq | EXTENDED_RELEASE_TABLET | Freq: Once | ORAL | Status: AC
Start: 2021-12-22 — End: 2021-12-22
  Administered 2021-12-22: 40 meq via ORAL
  Filled 2021-12-22: qty 2

## 2021-12-22 NOTE — Assessment & Plan Note (Addendum)
Baseline CKD stage IIIa ?AKI suspect prerenal from AMS and reduced PO.  Resolved with IV fluids.  ?Renal u/s without signs obstruction ?-Labs stable today off IV fluids ?- monitor BMP and follow-up ?

## 2021-12-22 NOTE — Progress Notes (Signed)
Occupational Therapy Treatment ?Patient Details ?Name: Alex Hughes ?MRN: [id number] ?DOB: 1946/06/07 ?Today's Date: 12/22/2021 ? ? ?History of present illness Alex Hughes is a 76 y.o. male with medical history significant of HTN and back pain. Pt admitted with UTI and AKI. ?  ?OT comments ? Upon entering the room, pt supine in bed and bed alarm going off. Pt reports needing for toileting and performed bed mobility without physical assistance. Pt stands with min A and then transfers with min HHA into bathroom onto regular toilet. Pt having BM and remains on commode for thorough hygiene. Pt stands with  min A and performs hand washing at sink with min guard for standing balance. Pt requesting to "go for a walk" and he was restless prior to therapist arrival. Pt ambulates 200' with min HHA around nursing station. Pt unable to navigate back to room without max assist for direction/navigation. Pt returning to bed and NT arrives for vitals. All needs within reach and pt remains in bed at end of session with bed alarm activated .  ? ?Recommendations for follow up therapy are one component of a multi-disciplinary discharge planning process, led by the attending physician.  Recommendations may be updated based on patient status, additional functional criteria and insurance authorization. ?   ?Follow Up Recommendations ? Home health OT  ?  ?Assistance Recommended at Discharge Frequent or constant Supervision/Assistance  ?Patient can return home with the following ? A little help with walking and/or transfers;A little help with bathing/dressing/bathroom;Assistance with cooking/housework;Direct supervision/assist for medications management;Direct supervision/assist for financial management;Assist for transportation;Help with stairs or ramp for entrance ?  ?Equipment Recommendations ? Other (comment) (RW)  ?  ?   ?Precautions / Restrictions Precautions ?Precautions: Fall  ? ? ?  ? ?Mobility Bed Mobility ?Overal bed mobility:  Needs Assistance ?Bed Mobility: Supine to Sit, Sit to Supine ?  ?  ?Supine to sit: Supervision ?Sit to supine: Supervision ?  ?General bed mobility comments: no physical assistance with min cuing for safety awareness ?  ? ?Transfers ?Overall transfer level: Needs assistance ?Equipment used: 1 person hand held assist ?Transfers: Sit to/from Stand, Bed to chair/wheelchair/BSC ?Sit to Stand: Min assist ?Stand pivot transfers: Min assist ?  ?Step pivot transfers: Min assist ?  ?  ?  ?  ?  ?Balance Overall balance assessment: Needs assistance ?Sitting-balance support: Feet supported, No upper extremity supported ?Sitting balance-Leahy Scale: Good ?  ?  ?Standing balance support: During functional activity ?Standing balance-Leahy Scale: Fair ?Standing balance comment: min guard- min A for functional mobility/transfers ?  ?  ?  ?  ?  ?  ?  ?  ?  ?  ?  ?   ? ?ADL either performed or assessed with clinical judgement  ? ?ADL Overall ADL's : Needs assistance/impaired ?  ?  ?Grooming: Wash/dry hands;Wash/dry face;Standing;Min guard ?  ?  ?  ?  ?  ?  ?  ?  ?  ?Toilet Transfer: Minimal Teacher, English as a foreign language;Ambulation ?  ?Toileting- Clothing Manipulation and Hygiene: Minimal assistance;Sit to/from stand ?  ?  ?  ?  ?  ?  ? ?Extremity/Trunk Assessment Upper Extremity Assessment ?Upper Extremity Assessment: Overall WFL for tasks assessed ?  ?Lower Extremity Assessment ?Lower Extremity Assessment: Overall WFL for tasks assessed ?  ?  ?  ? ?Vision Patient Visual Report: No change from baseline ?  ?  ?   ?   ? ?Cognition Arousal/Alertness: Awake/alert ?Behavior During Therapy: Impulsive ?Overall Cognitive Status: No  family/caregiver present to determine baseline cognitive functioning ?  ?  ?  ?  ?  ?  ?  ?  ?  ?  ?  ?  ?  ?  ?  ?  ?General Comments: Pt oriented to self and location only. Min cuing for safety awareness. ?  ?  ?   ?   ?   ?   ? ? ?Pertinent Vitals/ Pain       Pain Assessment ?Pain Assessment: Faces ?Faces Pain  Scale: No hurt ? ?   ?   ? ?Frequency ? Min 2X/week  ? ? ? ? ?  ?Progress Toward Goals ? ?OT Goals(current goals can now be found in the care plan section) ? Progress towards OT goals: Progressing toward goals ? ?Acute Rehab OT Goals ?Patient Stated Goal: "to go home tomorrow" ?OT Goal Formulation: With patient ?Time For Goal Achievement: 01/04/22 ?Potential to Achieve Goals: Good  ?Plan Discharge plan remains appropriate;Frequency remains appropriate   ? ?   ?AM-PAC OT "6 Clicks" Daily Activity     ?Outcome Measure ? ? Help from another person eating meals?: None ?Help from another person taking care of personal grooming?: A Little ?Help from another person toileting, which includes using toliet, bedpan, or urinal?: A Little ?Help from another person bathing (including washing, rinsing, drying)?: A Little ?Help from another person to put on and taking off regular upper body clothing?: None ?Help from another person to put on and taking off regular lower body clothing?: A Little ?6 Click Score: 20 ? ?  ?End of Session   ? ?OT Visit Diagnosis: Unsteadiness on feet (R26.81);Muscle weakness (generalized) (M62.81) ?  ?Activity Tolerance Patient tolerated treatment well ?  ?Patient Left in bed;with call bell/phone within reach;with bed alarm set;with nursing/sitter in room ?  ?Nurse Communication Mobility status ?  ? ?   ? ?Time: 4166-0630 ?OT Time Calculation (min): 23 min ? ?Charges: OT General Charges ?$OT Visit: 1 Visit ?OT Treatments ?$Self Care/Home Management : 8-22 mins ?$Therapeutic Activity: 8-22 mins ? ?Darleen Crocker, [place], OTR/L , CBIS ?ascom [phone]  ?12/22/21, 3:30 PM  ?

## 2021-12-22 NOTE — Assessment & Plan Note (Signed)
I agree with the wound description as below. ?--diligent wound care ?--frequent repositioning ?--monitor closely for s/sx;s of ifnection ? ?Pressure Injury 12/20/21 Buttocks Stage 1 -  Intact skin with non-blanchable redness of a localized area usually over a bony prominence. (Active)  ?12/20/21 1834  ?Location: Buttocks  ?Location Orientation:   ?Staging: Stage 1 -  Intact skin with non-blanchable redness of a localized area usually over a bony prominence.  ?Wound Description (Comments):   ?Present on Admission: Yes  ? ? ? ? ? ?

## 2021-12-22 NOTE — Assessment & Plan Note (Addendum)
BP stable with meds held at time of admission. ?--Continue metoprolol ?--Stop amlodipine for now to avoid hypotension.  Resume and follow-up if indicated ?

## 2021-12-22 NOTE — Assessment & Plan Note (Signed)
Mildly low, t3/t4 not elevated ?- outpt f/u ?

## 2021-12-22 NOTE — Assessment & Plan Note (Addendum)
LFTs in the 100s, t bili elevated to 4.5. acetaminophen normal. RUQ U/S showed cholelithiasis, otherwise normal.  ?Also with AKI and encephalopathy.  ? ?LFTs improving with fluids, so at this point liver ischemia most likely etiology.  ?Hepatitis panel neg, iron panel neg, UDS neg. No evidence of infection. ? ?MRCP on 3/7 showed  ?1. Cholelithiasis in a distended gallbladder without MRI evidence of acute cholecystitis. No biliary ductal dilation or choledocholithiasis. ?2. Trace fluid along the head/uncinate process of the pancreas, recommend correlation with serum lipase. No pancreatic ductal dilation ? ?- GI consulted, appreciate recs ?-Treated with IV fluids  ?-Repeat LFTs in 1 to 2 weeks at follow-up ?

## 2021-12-22 NOTE — Assessment & Plan Note (Addendum)
Suspect related to acute liver disease. ?Platelets stable, mildly improved. ?Repeat CBC at follow-up. ?

## 2021-12-22 NOTE — Assessment & Plan Note (Addendum)
Remains altered but appears to be improving. Possibly 2/2 liver & kidney dysfunction. No significant thyroid function derangement. B12 wnl. Corticol slightly elevated.   ?CT head nothing acute. ?MRI brain also negative. ?- sitter PRN safety ?-Haldol as needed, has not required for the past few days ?- delirium precautions ?- daily ekg for qtc monitoring -QTc was stable ?

## 2021-12-22 NOTE — Hospital Course (Addendum)
Alex Hughes is a 76 y.o. male with medical history significant of HTN and back pain coming for AMS and weakness.  Family reported that since Friday, pt had significant decline in ambulation and worsening confusion over a few weeks and multiple falls in prior few days.  ? ?Admitted for UTI and AKI. ? ? ?3/9: Patient is clinically improved and labs are stable.  He is medically cleared for discharge home with home health PT and OT today. ?

## 2021-12-22 NOTE — Progress Notes (Signed)
?Progress Note ? ? ?Patient: Alex Hughes IYM:[id number] DOB: 1946/09/07 DOA: 12/19/2021     2 ?DOS: the patient was seen and examined on 12/22/2021 ?  ?Brief hospital course: ?KACETON VIEAU is a 76 y.o. male with medical history significant of HTN and back pain coming for AMS and weakness.  Family reported that since Friday, pt had significant decline in ambulation and worsening confusion over a few weeks and multiple falls in prior few days.  ? ?Admitted for UTI and AKI. ? ?Pt has chronic back issues and his right leg is weakness and has drop foot.  Daughter says he doe not take tylenol but has Aleve.  ? ?Assessment and Plan: ?* Acute metabolic encephalopathy ?Remains altered but appears to be improving. Possibly 2/2 liver & kidney dysfunction. No significant thyroid function derangement. B12 wnl. Corticol slightly elevated.   ?CT head nothing acute. ?MRI brain also negative. ?- sitter PRN safety ?- delirium precautions ?- daily ekg for qtc monitoring as requiring occasional haldol ? ?Acute liver failure without hepatic coma ?LFTs in the 100s, t bili elevated to 4.5. acetaminophen normal. RUQ U/S showed cholelithiasis, otherwise normal.  ?Also with AKI and encephalopathy.  ? ?LFTs improving with fluids, so at this point liver ischemia most likely etiology.  ?Hepatitis panel neg, iron panel neg, UDS neg. No evidence of infection. ? ?MRCP on 3/7 showed  ?1. Cholelithiasis in a distended gallbladder without MRI evidence of acute cholecystitis. No biliary ductal dilation or choledocholithiasis. ?2. Trace fluid along the head/uncinate process of the pancreas, recommend correlation with serum lipase. No pancreatic ductal dilation ? ?- GI consulted, appreciate recs ?- continue fluids - change to LR due to hyperchloremic metabolic acidosis ? ?Acute kidney injury superimposed on chronic kidney disease ([place]) ?Suspect prerenal from AMS and reduced PO.  Improving with fluids.  ?Renal u/s without signs obstruction ?- continue  fluids ?- monitor BMP ? ?Bacteriuria ?Urine culture is negative.  Stopped antibiotics. ?- f/u blood cultures, neg to date ? ?Essential hypertension ?BP stable with meds held. ?Continue holding home amlodipine, metop for now. ? ?Thrombocytopenia ([place]) ?Suspect related to acute liver disease. ?Platelets stable, mildly improved. ?Monitor CBC. ? ?Low TSH level ?Mildly low, t3/t4 not elevated ?- outpt f/u ? ?Pressure injury of skin ?I agree with the wound description as below. ?--diligent wound care ?--frequent repositioning ?--monitor closely for s/sx;s of ifnection ? ?Pressure Injury 12/20/21 Buttocks Stage 1 -  Intact skin with non-blanchable redness of a localized area usually over a bony prominence. (Active)  ?12/20/21 1834  ?Location: Buttocks  ?Location Orientation:   ?Staging: Stage 1 -  Intact skin with non-blanchable redness of a localized area usually over a bony prominence.  ?Wound Description (Comments):   ?Present on Admission: Yes  ? ? ? ? ? ? ? ? ? ?  ? ?Subjective: Patient awake sitting up in bed, seen with daughter at bedside this morning.  He denies any acute complaints this morning.  He specifically denies abdominal pain nausea or vomiting.  Reports wanting to go home tomorrow.  Agreeable to try advancing diet and monitoring.  He and daughter are agreeable. ? ?Physical Exam: ?Vitals:  ? 12/22/21 0357 12/22/21 0752 12/22/21 1100 12/22/21 1521  ?BP: 117/70 127/75 131/77 138/82  ?Pulse: 60 64 60 (!) 56  ?Resp: 20 18 18 18   ?Temp: 97.9 ?F (36.6 ?C) 98.2 ?F (36.8 ?C) 98.1 ?F (36.7 ?C) 97.7 ?F (36.5 ?C)  ?TempSrc:  Oral Oral   ?SpO2: 95% 100% 98% 93%  ?  Weight:      ?Height:      ? ?General exam: awake, alert, no acute distress, pleasantly confused (mild ?HEENT: atraumatic, clear conjunctiva, anicteric sclera, moist mucus membranes, hearing grossly normal  ?Respiratory system: CTAB, no wheezes, rales or rhonchi, normal respiratory effort. ?Cardiovascular system: normal S1/S2, RRR, no pedal edema.    ?Gastrointestinal system: soft, NT, ND, no HSM felt, +bowel sounds. ?Central nervous system: A&O x3. no gross focal neurologic deficits, normal speech ?Extremities: moves all, no edema, normal tone ?Skin: dry, intact, normal temperature ?Psychiatry: normal mood, congruent affect ? ? ?Data Reviewed: ? ?Labs reviewed and notable for potassium 3.4, chloride 114, CO2 19, BUN 29, creatinine 1.64, alk phos 136, albumin 2.7, lipase 92, (improved), ALT 204, AST normalized, total protein 5.2, total bili 2.0.  CBC notable for platelets 113, hemoglobin 12.1. ? ?MRCP impression: ?1. Cholelithiasis in a distended gallbladder without MRI evidence of ?acute cholecystitis. No biliary ductal dilation or ?choledocholithiasis. ?2. Trace fluid along the head/uncinate process of the pancreas, ?recommend correlation with serum lipase. No pancreatic ductal ?dilation. ? ?MRI brain impression: ?No acute or reversible finding. No evidence abnormal prominence of ?the right lateral ventricle as questioned clinically. 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 If anything, the left ?lateral ventricle is more prominent than the right probably due to ?asymmetric deep white matter volume loss on the left. ?  ? ?Family Communication: Daughter at bedside on rounds today ? ?Disposition: ?Status is: Inpatient ?Remains inpatient appropriate because: Severity of illness remaining on IV fluids for AKI.  Advance diet today and assess for tolerance. ? ? ? Planned Discharge Destination: Home with Home Health ? ? ? ?Time spent: 35 minutes ? ?Author: ?Ezekiel Slocumb, DO ?12/22/2021 3:34 PM ? ?For on call review [email or web address].  ?

## 2021-12-22 NOTE — Assessment & Plan Note (Signed)
Urine culture is negative.  Stopped antibiotics. ?- f/u blood cultures, neg to date ?

## 2021-12-23 LAB — CBC
HCT: 37.4 % — ABNORMAL LOW (ref 39.0–52.0)
Hemoglobin: 12.4 g/dL — ABNORMAL LOW (ref 13.0–17.0)
MCH: 27.6 pg (ref 26.0–34.0)
MCHC: 33.2 g/dL (ref 30.0–36.0)
MCV: 83.1 fL (ref 80.0–100.0)
Platelets: 116 10*3/uL — ABNORMAL LOW (ref 150–400)
RBC: 4.5 MIL/uL (ref 4.22–5.81)
RDW: 13.9 % (ref 11.5–15.5)
WBC: 5 10*3/uL (ref 4.0–10.5)
nRBC: 0 % (ref 0.0–0.2)

## 2021-12-23 LAB — COMPREHENSIVE METABOLIC PANEL
ALT: 166 U/L — ABNORMAL HIGH (ref 0–44)
AST: 60 U/L — ABNORMAL HIGH (ref 15–41)
Albumin: 2.8 g/dL — ABNORMAL LOW (ref 3.5–5.0)
Alkaline Phosphatase: 160 U/L — ABNORMAL HIGH (ref 38–126)
Anion gap: 6 (ref 5–15)
BUN: 25 mg/dL — ABNORMAL HIGH (ref 8–23)
CO2: 19 mmol/L — ABNORMAL LOW (ref 22–32)
Calcium: 9.4 mg/dL (ref 8.9–10.3)
Chloride: 113 mmol/L — ABNORMAL HIGH (ref 98–111)
Creatinine, Ser: 1.45 mg/dL — ABNORMAL HIGH (ref 0.61–1.24)
GFR, Estimated: 51 mL/min — ABNORMAL LOW (ref >60)
Glucose, Bld: 100 mg/dL — ABNORMAL HIGH (ref 70–99)
Potassium: 3.9 mmol/L (ref 3.5–5.1)
Sodium: 138 mmol/L (ref 135–145)
Total Bilirubin: 1.5 mg/dL — ABNORMAL HIGH (ref 0.3–1.2)
Total Protein: 5.3 g/dL — ABNORMAL LOW (ref 6.5–8.1)

## 2021-12-23 LAB — EPSTEIN-BARR VIRUS (EBV) ANTIBODY PROFILE
EBV NA IgG: >600 U/mL — ABNORMAL HIGH (ref 0.0–17.9)
EBV VCA IgG: 284 U/mL — ABNORMAL HIGH (ref 0.0–17.9)
EBV VCA IgM: <36 U/mL (ref 0.0–35.9)

## 2021-12-23 LAB — MAGNESIUM: Magnesium: 1.7 mg/dL (ref 1.7–2.4)

## 2021-12-23 NOTE — Progress Notes (Signed)
Patient refused to wait for walker to be delivered. Dc'd home via daughter. ?

## 2021-12-23 NOTE — TOC Progression Note (Addendum)
Transition of Care (TOC) - Progression Note  ? ? ?Patient Details  ?Name: Alex Hughes ?MRN: [id number] ?Date of Birth: May 26, 1946 ? ?Transition of Care (TOC) CM/SW Contact  ?Barnie Sopko A Collie Kittel, LCSW ?Phone Number: ?12/23/2021, 11:10 AM ? ?Clinical Narrative:   PT recommending HH. CSW spoke with pt regarding this and pt initially declined [facility] and asked for appointments. However, RN spoke to pt following the convo and pt is now agreeable to [facility]. CSW will send referral, anticipate a barrier with HH due to insurance. ? ?[place] will accept patient.  ? ?Expected Discharge Plan: [place] ?Barriers to Discharge: Continued Medical Work up ? ?Expected Discharge Plan and Services ?Expected Discharge Plan: [place] ?  ?Discharge Planning Services: CM Consult ?  ?Living arrangements for the past 2 months: [place] ?Expected Discharge Date: 12/23/21               ?DME Arranged: N/A ?DME Agency: NA ?  ?  ?  ?  ?  ?  ?  ?  ? ? ?Social Determinants of Health (SDOH) Interventions ?  ? ?Readmission Risk Interventions ?No flowsheet data found. ? ?

## 2021-12-23 NOTE — Discharge Summary (Addendum)
Physician Discharge Summary   Patient: Alex Hughes MRN: [id number] DOB: [birth year]  Admit date:     12/19/2021  Discharge date: 12/27/21  Discharge Physician: Ezekiel Slocumb   PCP: Glean Hess, MD   Recommendations at discharge:   Follow-up with primary care in 1 to 2 weeks Repeat CMP/CBC/Mg in 1 to 2 weeks   Discharge Diagnoses: Principal Problem:   Acute metabolic encephalopathy Active Problems:   Acute liver failure without hepatic coma   Acute kidney injury superimposed on chronic kidney disease ([place])   Bacteriuria   Essential hypertension   Pressure injury of skin   Low TSH level   Thrombocytopenia [facility])   Hospital Course: [name] is a 76 y.o. male with medical history significant of HTN and back pain coming for AMS and weakness.  Family reported that since Friday, pt had significant decline in ambulation and worsening confusion over a few weeks and multiple falls in prior few days.   Admitted for UTI and AKI.   3/9: Patient is clinically improved and labs are stable.  He is medically cleared for discharge home with home health PT and OT today.  Assessment and Plan: * Acute metabolic encephalopathy Remains altered but appears to be improving. Possibly 2/2 liver & kidney dysfunction. No significant thyroid function derangement. B12 wnl. Corticol slightly elevated.   CT head nothing acute. MRI brain also negative. - sitter PRN safety -Haldol as needed, has not required for the past few days - delirium precautions - daily ekg for qtc monitoring -QTc was stable  Acute liver failure without hepatic coma LFTs in the 100s, t bili elevated to 4.5. acetaminophen normal. RUQ U/S showed cholelithiasis, otherwise normal.  Also with AKI and encephalopathy.   LFTs improving with fluids, so at this point liver ischemia most likely etiology.  Hepatitis panel neg, iron panel neg, UDS neg. No evidence of infection.  MRCP on 3/7 showed  1. Cholelithiasis in a  distended gallbladder without MRI evidence of acute cholecystitis. No biliary ductal dilation or choledocholithiasis. 2. Trace fluid along the head/uncinate process of the pancreas, recommend correlation with serum lipase. No pancreatic ductal dilation  - GI consulted, appreciate recs -Treated with IV fluids  -Repeat LFTs in 1 to 2 weeks at follow-up  Acute kidney injury superimposed on chronic kidney disease ([place]) Baseline CKD stage IIIa AKI suspect prerenal from AMS and reduced PO.  Resolved with IV fluids.  Renal u/s without signs obstruction -Labs stable today off IV fluids - monitor BMP and follow-up  Bacteriuria Urine culture is negative.  Stopped antibiotics. - f/u blood cultures, neg to date  Essential hypertension BP stable with meds held at time of admission. --Continue metoprolol --Stop amlodipine for now to avoid hypotension.  Resume and follow-up if indicated  Thrombocytopenia ([place]) Suspect related to acute liver disease. Platelets stable, mildly improved. Repeat CBC at follow-up.  Low TSH level Mildly low, t3/t4 not elevated - outpt f/u  Pressure injury of skin I agree with the wound description as below. --diligent wound care --frequent repositioning --monitor closely for s/sx;s of ifnection  Pressure Injury 12/20/21 Buttocks Stage 1 -  Intact skin with non-blanchable redness of a localized area usually over a bony prominence. (Active)  12/20/21 1834  Location: Buttocks  Location Orientation:   Staging: Stage 1 -  Intact skin with non-blanchable redness of a localized area usually over a bony prominence.  Wound Description (Comments):   Present on Admission: Yes  Consultants: Gastroenterology Procedures performed: None Disposition: Home health Diet recommendation:  Cardiac diet DISCHARGE MEDICATION: Allergies as of 12/23/2021       Reactions   Lisinopril Swelling   Facial Swelling   Elemental Sulfur Other (See Comments)    Urinated blood   Sulfa Antibiotics Rash        Medication List     STOP taking these medications    amLODipine 10 MG tablet Commonly known as: NORVASC       TAKE these medications    metoprolol tartrate 25 MG tablet Commonly known as: LOPRESSOR TAKE 1 TABLET(25 MG) BY MOUTH DAILY   naproxen sodium 220 MG tablet Commonly known as: ALEVE Take 220 mg by mouth 2 (two) times daily as needed.        Follow-up Information     Glean Hess, MD. Go on 12/27/2021.   Specialty: Internal Medicine Why: hospital follow up;  Appt @ 2:00 pm Contact information: [address] [place] [address] [phone]                Discharge Exam: Danley Danker Weights   12/19/21 1734  Weight: 75.3 kg   General exam: awake, alert, no acute distress, pleasant with very mild confusion HEENT: atraumatic, clear conjunctiva, anicteric sclera, moist mucus membranes, hearing grossly normal  Respiratory system: CTAB, no wheezes, rales or rhonchi, normal respiratory effort. Cardiovascular system: normal S1/S2, RRR, no JVD, murmurs, rubs, gallops, no pedal edema.   Gastrointestinal system: soft, NT, ND, no HSM felt, +bowel sounds. Central nervous system: A&O x3. no gross focal neurologic deficits, normal speech Extremities: moves all, no edema, normal tone Skin: dry, intact, normal temperature Psychiatry: normal mood, congruent affect    Condition at discharge: stable  The results of significant diagnostics from this hospitalization (including imaging, microbiology, ancillary and laboratory) are listed below for reference.   Imaging Studies: CT Head Wo Contrast  Result Date: 12/19/2021 CLINICAL DATA:  Balance issues.  Generalized weakness. EXAM: CT HEAD WITHOUT CONTRAST TECHNIQUE: Contiguous axial images were obtained from the base of the skull through the vertex without intravenous contrast. RADIATION DOSE REDUCTION: This exam was performed according to the departmental  dose-optimization program which includes automated exposure control, adjustment of the mA and/or kV according to patient size and/or use of iterative reconstruction technique. COMPARISON:  None. FINDINGS: Brain: No intracranial hemorrhage, mass effect, or midline shift. Age related atrophy. No hydrocephalus. The basilar cisterns are patent. No evidence of territorial infarct or acute ischemia. Mild to moderate chronic small vessel ischemic change. No extra-axial or intracranial fluid collection. Vascular: Atherosclerosis of skullbase vasculature without hyperdense vessel or abnormal calcification. Skull: No fracture or focal lesion. Sinuses/Orbits: Paranasal sinuses and mastoid air cells are clear. The visualized orbits are unremarkable. Other: None. IMPRESSION: 1. No acute intracranial abnormality. 2. Age related atrophy and chronic small vessel ischemic change. Electronically Signed   By: Keith Rake M.D.   On: 12/19/2021 19:07   MR BRAIN WO CONTRAST  Result Date: 12/21/2021 CLINICAL DATA:  Acute encephalopathy. Follow-up abnormal head CT. Clinical history states focal asymmetric prominence of the anterior body of the right lateral ventricle. EXAM: MRI HEAD WITHOUT CONTRAST TECHNIQUE: Multiplanar, multiecho pulse sequences of the brain and surrounding structures were obtained without intravenous contrast. COMPARISON:  Head CT 12/19/2021 FINDINGS: Brain: The study suffers from motion degradation. Diffusion imaging does not show any acute or subacute stroke or other cause of restricted diffusion. No focal abnormality is seen affecting the brainstem or cerebellum.  Cerebral hemispheres show mild chronic small-vessel ischemic changes of the white matter, asymmetrically more pronounced on the left than the right. I do not see evidence of abnormal enlargement of the right lateral ventricle. If anything, the left lateral ventricle is larger than the right, possibly due to asymmetric central atrophy from the  left-sided predominant deep white matter small vessel ischemic change. No large vessel territory infarction. No mass, hemorrhage, hydrocephalus or extra-axial collection. Vascular: Major vessels at the base of the brain show flow. Skull and upper cervical spine: Negative Sinuses/Orbits: Clear/normal Other: None IMPRESSION: No acute or reversible finding. No evidence abnormal prominence of the right lateral ventricle as questioned clinically. The patient has chronic small-vessel ischemic changes of the deep white matter, more pronounced on the left than the right. If anything, the left lateral ventricle is more prominent than the right probably due to asymmetric deep white matter volume loss on the left. Electronically Signed   By: Nelson Chimes M.D.   On: 12/21/2021 18:40   US RENAL  Result Date: 12/20/2021 CLINICAL DATA:  A KI EXAM: RENAL / URINARY TRACT ULTRASOUND COMPLETE COMPARISON:  CT Feb 17, 2015. FINDINGS: Right Kidney: Renal measurements: 12.9 x 6.8 x 5.0 cm = volume: 229 mL. Echogenicity within normal limits. No hydronephrosis. Simple appearing cysts for within the upper and midpole, measuring 3.6 x3.6 cm and 1.1 x 0.9 cm respectively. Shadowing calculus measuring 1.3 cm. Left Kidney: Renal measurements: 11.2 x 5.9 x 6.7 cm = volume: 230 mL. Echogenicity within normal limits. No hydronephrosis. Simple appearing cyst, measuring 1.7 x 1.6 cm. Shadowing calculus, measuring 1.3 cm. Bladder: Appears normal for degree of bladder distention. 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 This mass measures approximately 7.4 x 7.5 x 7.5 cm, not substantially changed when comparing across modalities. IMPRESSION: 1. No hydronephrosis. 2. Bilateral nephrolithiasis and renal cysts. 3. 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 This mass measures approximately 7.5  cm, not substantially changed when comparing across modalities. Electronically Signed   By: Margaretha Sheffield M.D.   On: 12/20/2021 15:41   MR ABDOMEN MRCP WO CONTRAST  Result Date: 12/21/2021 CLINICAL DATA:  Cholelithiasis. EXAM: MRI ABDOMEN WITHOUT CONTRAST  (INCLUDING MRCP) TECHNIQUE: Multiplanar multisequence MR imaging of the abdomen was performed. Heavily T2-weighted images of the biliary and pancreatic ducts were obtained, and three-dimensional MRCP images were rendered by post processing. COMPARISON:  Ultrasound December 19, 2021 FINDINGS: Despite efforts by the technologist and patient, motion artifact is present on today's exam and could not be eliminated. This reduces exam sensitivity and specificity. Lower chest: Heterogeneous signal in the lung bases favor atelectasis. Hepatobiliary: No hepatic steatosis. No suspicious hepatic lesion on this noncontrast examination. Cholelithiasis in a distended gallbladder without gallbladder wall thickening or pericholecystic fluid. No biliary ductal dilation. No choledocholithiasis. Pancreas: No pancreatic ductal dilation. There is trace inflammatory fluid along the head/uncinate process of the pancreas. 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 Adrenals/Urinary Tract: Bilateral adrenal glands appear normal. No hydronephrosis. Bilateral renal cysts. Stomach/Bowel: Duodenal diverticulum. No pathologic dilation or evidence of acute inflammation involving loops of large or small bowel in the abdomen. Vascular/Lymphatic: Normal caliber abdominal aorta. No pathologically enlarged abdominal lymph nodes. Other: No significant abdominopelvic free fluid and no walled off fluid collections. Musculoskeletal: No suspicious bone lesions  identified. Multilevel degenerative changes spine. IMPRESSION: Motion degraded examination, within this context: 1.  Cholelithiasis in a distended gallbladder without MRI evidence of acute cholecystitis. No biliary ductal dilation or choledocholithiasis. 2. Trace fluid along the head/uncinate process of the pancreas, recommend correlation with serum lipase. No pancreatic ductal dilation. Electronically Signed   By: Dahlia Bailiff M.D.   On: 12/21/2021 18:14   DG Chest Port 1 View  Result Date: 12/20/2021 CLINICAL DATA:  Altered mental status EXAM: PORTABLE CHEST 1 VIEW COMPARISON:  None. FINDINGS: Low lung volumes. Top-normal heart size. Normal mediastinal contour. No pneumothorax. No pleural effusion. No overt pulmonary edema. Mild hazy right lung base opacity. IMPRESSION: Low lung volumes. Mild hazy right lung base opacity, favor atelectasis. Electronically Signed   By: Ilona Sorrel M.D.   On: 12/20/2021 16:51   US Abdomen Limited RUQ (LIVER/GB)  Result Date: 12/19/2021 CLINICAL DATA:  Elevated LFTs EXAM: ULTRASOUND ABDOMEN LIMITED RIGHT UPPER QUADRANT COMPARISON:  None. FINDINGS: Gallbladder: Gallbladder is well distended with multiple gallstones. No wall thickening or pericholecystic fluid is noted. Negative sonographic Murphy's sign is elicited. Common bile duct: Diameter: 5 mm Liver: No focal lesion identified. Within normal limits in parenchymal echogenicity. Portal vein is patent on color Doppler imaging with normal direction of blood flow towards the liver. Other: None. IMPRESSION: Cholelithiasis without complicating factors. Electronically Signed   By: Inez Catalina M.D.   On: 12/19/2021 20:31    Microbiology: Results for orders placed or performed during the hospital encounter of 12/19/21  Resp Panel by RT-PCR (Flu A&B, Covid) Nasopharyngeal Swab     Status: None   Collection Time: 12/19/21  6:18 PM   Specimen: Nasopharyngeal Swab; Nasopharyngeal(NP) swabs in vial transport medium  Result Value Ref Range Status   SARS Coronavirus 2 by RT PCR NEGATIVE NEGATIVE Final    Comment: (NOTE) SARS-CoV-2 target  nucleic acids are NOT DETECTED.  The SARS-CoV-2 RNA is generally detectable in upper respiratory specimens during the acute phase of infection. The lowest concentration of SARS-CoV-2 viral copies this assay can detect is 138 copies/mL. A negative result does not preclude SARS-Cov-2 infection and should not be used as the sole basis for treatment or other patient management decisions. A negative result may occur with  improper specimen collection/handling, submission of specimen other than nasopharyngeal swab, presence of viral mutation(s) within the areas targeted by this assay, and inadequate number of viral copies(<138 copies/mL). A negative result must be combined with clinical observations, patient history, and epidemiological information. The expected result is Negative.  Fact Sheet for Patients:  EntrepreneurPulse.com.au  Fact Sheet for Healthcare Providers:  IncredibleEmployment.be  This test is no t yet approved or cleared by the [place] FDA and  has been authorized for detection and/or diagnosis of SARS-CoV-2 by FDA under an Emergency Use Authorization (EUA). This EUA will remain  in effect (meaning this test can be used) for the duration of the COVID-19 declaration under Section 564(b)(1) of the Act, 21 U.S.C.section 360bbb-3(b)(1), unless the authorization is terminated  or revoked sooner.       Influenza A by PCR NEGATIVE NEGATIVE Final   Influenza B by PCR NEGATIVE NEGATIVE Final    Comment: (NOTE) The Xpert Xpress SARS-CoV-2/FLU/RSV plus assay is intended as an aid in the diagnosis of influenza from Nasopharyngeal swab specimens and should not be used as a sole basis for treatment. Nasal washings and aspirates are unacceptable for Xpert Xpress SARS-CoV-2/FLU/RSV testing.  Fact Sheet for Patients: EntrepreneurPulse.com.au  Fact Sheet for  Healthcare  Providers: IncredibleEmployment.be  This test is not yet approved or cleared by the [place] and has been authorized for detection and/or diagnosis of SARS-CoV-2 by FDA under an Emergency Use Authorization (EUA). This EUA will remain in effect (meaning this test can be used) for the duration of the COVID-19 declaration under Section 564(b)(1) of the Act, 21 U.S.C. section 360bbb-3(b)(1), unless the authorization is terminated or revoked.  Performed at [facility], [address] [place], [place] [address]   Urine Culture     Status: None   Collection Time: 12/19/21  6:27 PM   Specimen: Urine, Clean Catch  Result Value Ref Range Status   Specimen Description   Final    URINE, CLEAN CATCH Performed at [facility], [address], [place], [place] [address]    Special Requests   Final    NONE Performed at [facility], [address], [place], [place] [address]    Culture   Final    NO GROWTH Performed at [facility], [place] [address] [place], [place] [address]    Report Status 12/21/2021 FINAL  Final  CULTURE, BLOOD (ROUTINE X 2) w Reflex to ID Panel     Status: None   Collection Time: 12/20/21 11:08 AM   Specimen: BLOOD  Result Value Ref Range Status   Specimen Description BLOOD RIGHT FA  Final   Special Requests   Final    BOTTLES DRAWN AEROBIC AND ANAEROBIC Blood Culture adequate volume   Culture   Final    NO GROWTH 5 DAYS Performed at [facility], [address] [place], [place] [address]    Report Status 12/25/2021 FINAL  Final  Culture, blood (Routine X 2) w Reflex to ID Panel     Status: None   Collection Time: 12/20/21 11:38 PM   Specimen: BLOOD  Result Value Ref Range Status   Specimen Description BLOOD LEFT ASSIST CONTROL  Final   Special Requests   Final    BOTTLES DRAWN AEROBIC AND ANAEROBIC Blood Culture results may not be optimal due to an excessive volume of blood received in  culture bottles   Culture   Final    NO GROWTH 5 DAYS Performed at [facility], [address] [place], [place] [address]    Report Status 12/26/2021 FINAL  Final    Labs: CBC: Recent Labs  Lab 12/21/21 0351 12/22/21 0320 12/23/21 0815  WBC 5.8 4.7 5.0  HGB 13.1 12.1* 12.4*  HCT 38.9* 36.1* 37.4*  MCV 82.8 81.9 83.1  PLT 108* 113* 144*   Basic Metabolic Panel: Recent Labs  Lab 12/21/21 0351 12/22/21 0320 12/23/21 0815  NA 140 139 138  K 3.4* 3.4* 3.9  CL 107 114* 113*  CO2 24 19* 19*  GLUCOSE 87 92 100*  BUN 31* 29* 25*  CREATININE 2.22* 1.64* 1.45*  CALCIUM 9.6 9.4 9.4  MG 2.0  --  1.7   Liver Function Tests: Recent Labs  Lab 12/21/21 0351 12/22/21 0320 12/23/21 0815  AST 58* 32 60*  ALT 334* 204* 166*  ALKPHOS 133* 136* 160*  BILITOT 3.2* 2.0* 1.5*  PROT 5.8* 5.2* 5.3*  ALBUMIN 3.0* 2.7* 2.8*   CBG: Recent Labs  Lab 12/21/21 0820 12/21/21 2105 12/21/21 2145 12/22/21 0751 12/22/21 1134  GLUCAP 76 66* 115* 81 120*    Discharge time spent: less than 30 minutes.  Signed: Ezekiel Slocumb, DO [facility] 12/27/2021

## 2021-12-23 NOTE — Progress Notes (Signed)
Pt has order for daily EKG for monitoring of Qtc due to use of IV Haldol.  Last dose of IV Haldol administered on 12/20/21 at 2230. EKG's have been obtained daily with QTC WNL and without increases; last EKG obtained on 3/8. ?

## 2021-12-23 NOTE — Progress Notes (Signed)
Discharge instructions reviewed with patient and patient's daughter. Verbalized understanding. Awaiting rolling walker before DC, daughter to drive patient home.  ?

## 2021-12-23 NOTE — Care Management Important Message (Signed)
Important Message ? ?Patient Details  ?Name: Alex Hughes ?MRN: [id number] ?Date of Birth: [birth year] ? ? ?Medicare Important Message Given:  Yes ? ? ? ? ?Alex Hughes ?12/23/2021, 10:48 AM ?

## 2021-12-24 ENCOUNTER — Telehealth: Payer: Self-pay

## 2021-12-24 NOTE — Telephone Encounter (Signed)
Transition Care Management Follow-up Telephone Call ?Date of discharge and from where: 12/23/21 [facility] ?How have you been since you were released from the hospital? Pt states he is doing okay  ?Any questions or concerns? No ? ?Items Reviewed: ?Did the pt receive and understand the discharge instructions provided? Yes  ?Medications obtained and verified? Yes  ?Other? No  ?Any new allergies since your discharge? No  ?Dietary orders reviewed? Yes ?Do you have support at home? Yes  ? ?Home Care and Equipment/Supplies: ?Were home health services ordered? yes ?If so, what is the name of the agency? [facility]  ?Has the agency set up a time to come to the patient's home? no ?Were any new equipment or medical supplies ordered?  Yes: rolling walker ?What is the name of the medical supply agency? Unknown - pt did not wait for walker to be delivered to room prior to discharge   ?Were you able to get the supplies/equipment? yes ?Do you have any questions related to the use of the equipment or supplies? No ? ?Functional Questionnaire: (I = Independent and D = Dependent) ?ADLs: I  ? ?Bathing/Dressing- I ? ?Meal Prep- I ? ?Eating- I ? ?Maintaining continence- I ? ?Transferring/Ambulation- I ? ?Managing Meds- D ? ?Follow up appointments reviewed: ? ?PCP Hospital f/u appt confirmed? Yes  Scheduled to see Dr. Army Melia on 12/27/21 @ 2:00. ?Are transportation arrangements needed? No  ?If their condition worsens, is the pt aware to call PCP or go to the Emergency Dept.? Yes ?Was the patient provided with contact information for the PCP's office or ED? Yes ?Was to pt encouraged to call back with questions or concerns? Yes ? ?

## 2021-12-25 LAB — CULTURE, BLOOD (ROUTINE X 2)
Culture: NO GROWTH
Special Requests: ADEQUATE

## 2021-12-26 LAB — CULTURE, BLOOD (ROUTINE X 2): Culture: NO GROWTH

## 2021-12-27 ENCOUNTER — Encounter: Payer: Self-pay | Admitting: Internal Medicine

## 2021-12-27 ENCOUNTER — Ambulatory Visit (INDEPENDENT_AMBULATORY_CARE_PROVIDER_SITE_OTHER): Payer: PPO | Admitting: Internal Medicine

## 2021-12-27 ENCOUNTER — Other Ambulatory Visit: Payer: Self-pay

## 2021-12-27 VITALS — BP 124/78 | HR 101 | Ht 72.0 in | Wt 172.0 lb

## 2021-12-27 DIAGNOSIS — R7989 Other specified abnormal findings of blood chemistry: Secondary | ICD-10-CM | POA: Diagnosis not present

## 2021-12-27 DIAGNOSIS — I10 Essential (primary) hypertension: Secondary | ICD-10-CM | POA: Diagnosis not present

## 2021-12-27 DIAGNOSIS — D696 Thrombocytopenia, unspecified: Secondary | ICD-10-CM

## 2021-12-27 DIAGNOSIS — L89301 Pressure ulcer of unspecified buttock, stage 1: Secondary | ICD-10-CM

## 2021-12-27 DIAGNOSIS — N179 Acute kidney failure, unspecified: Secondary | ICD-10-CM

## 2021-12-27 DIAGNOSIS — G9341 Metabolic encephalopathy: Secondary | ICD-10-CM

## 2021-12-27 DIAGNOSIS — N189 Chronic kidney disease, unspecified: Secondary | ICD-10-CM

## 2021-12-27 NOTE — Progress Notes (Signed)
Date:  12/27/2021   Name:  Alex Hughes   DOB:  [birth year]   MRN:  [id number]  Here today with his daughter Freida Busman. Chief Complaint: Hospitalization Follow-up Hospital follow up. Admitted to [facility] 12/19/21 to 12/24/21.  TOC call done on 12/24/21. He is improving - eating and drinking regularly now.  No falls since being home.  He does have some lesions on his left upper arm of unknown cause.  Recommendations at discharge:    Follow-up with primary care in 1 to 2 weeks Repeat CMP/CBC/Mg in 1 to 2 weeks     Discharge Diagnoses: Principal Problem:   Acute metabolic encephalopathy Active Problems:   Acute liver failure without hepatic coma   Acute kidney injury superimposed on chronic kidney disease ([place])   Bacteriuria   Essential hypertension   Pressure injury of skin   Low TSH level   Thrombocytopenia [facility])     Hospital Course: Alex Hughes is a 76 y.o. male with medical history significant of HTN and back pain coming for AMS and weakness.  Family reported that since Friday, pt had significant decline in ambulation and worsening confusion over a few weeks and multiple falls in prior few days.    Admitted for UTI and AKI.   3/9: Patient is clinically improved and labs are stable.  He is medically cleared for discharge home with home health PT and OT today.   Assessment and Plan: * Acute metabolic encephalopathy Remains altered but appears to be improving. Possibly 2/2 liver & kidney dysfunction. No significant thyroid function derangement. B12 wnl. Corticol slightly elevated.   CT head nothing acute. MRI brain also negative. - sitter PRN safety -Haldol as needed, has not required for the past few days - delirium precautions - daily ekg for qtc monitoring -QTc was stable  >>> He is basically back to baseline per his daughter.  He is living with his wife and another daughter who are making sure that he is eating well.  Acute liver failure without hepatic coma LFTs in the  100s, t bili elevated to 4.5. acetaminophen normal. RUQ U/S showed cholelithiasis, otherwise normal.  Also with AKI and encephalopathy.   >>> improving at discharge - will repeat today. LFTs improving with fluids, so at this point liver ischemia most likely etiology.  Hepatitis panel neg, iron panel neg, UDS neg. No evidence of infection.   MRCP on 3/7 showed  1. Cholelithiasis in a distended gallbladder without MRI evidence of acute cholecystitis. No biliary ductal dilation or choledocholithiasis. 2. Trace fluid along the head/uncinate process of the pancreas, recommend correlation with serum lipase. No pancreatic ductal dilation   - GI consulted, appreciate recs -Treated with IV fluids  -Repeat LFTs in 1 to 2 weeks at follow-up   Acute kidney injury superimposed on chronic kidney disease ([place]) Baseline CKD stage IIIa AKI suspect prerenal from AMS and reduced PO.  Resolved with IV fluids.  Renal u/s without signs obstruction -Labs stable today off IV fluids - monitor BMP and follow-up  >>> continue to intake sufficient fluids.  Repeat labs today. Bacteriuria Urine culture is negative.  Stopped antibiotics. - f/u blood cultures, neg to date  >>> all cultures are negative.  Essential hypertension BP stable with meds held at time of admission. --Continue metoprolol --Stop amlodipine for now to avoid hypotension.  Resume and follow-up if indicated  >>> BP is controlled on metoprolol alone.  Thrombocytopenia ([place]) Suspect related to acute liver disease. Platelets stable, mildly improved.  Repeat CBC at follow-up.  >>> CBC repeated today.  Low TSH level Mildly low, t3/t4 not elevated - outpt f/u   Pressure injury of skin I agree with the wound description as below. --diligent wound care --frequent repositioning --monitor closely for s/sx;s of ifnection  >>> family has not been monitoring.  He denies pain.  Pressure Injury 12/20/21 Buttocks Stage 1 -  Intact skin with  non-blanchable redness of a localized area usually over a bony prominence. (Active)  12/20/21 1834  Location: Buttocks  Location Orientation:   Staging: Stage 1 -  Intact skin with non-blanchable redness of a localized area usually over a bony prominence.  Wound Description (Comments):   Present on Admission: Yes   HPI  Lab Results  Component Value Date   NA 138 12/23/2021   K 3.9 12/23/2021   CO2 19 (L) 12/23/2021   GLUCOSE 100 (H) 12/23/2021   BUN 25 (H) 12/23/2021   CREATININE 1.45 (H) 12/23/2021   CALCIUM 9.4 12/23/2021   EGFR 53 (L) 12/01/2021   GFRNONAA 51 (L) 12/23/2021   Lab Results  Component Value Date   CHOL 173 12/01/2021   HDL 45 12/01/2021   LDLCALC 106 (H) 12/01/2021   TRIG 123 12/01/2021   CHOLHDL 3.8 12/01/2021   Lab Results  Component Value Date   TSH 0.269 (L) 12/20/2021   Lab Results  Component Value Date   HGBA1C 5.3 12/01/2021   Lab Results  Component Value Date   WBC 5.0 12/23/2021   HGB 12.4 (L) 12/23/2021   HCT 37.4 (L) 12/23/2021   MCV 83.1 12/23/2021   PLT 116 (L) 12/23/2021   Lab Results  Component Value Date   ALT 166 (H) 12/23/2021   AST 60 (H) 12/23/2021   GGT 218 (H) 12/19/2021   ALKPHOS 160 (H) 12/23/2021   BILITOT 1.5 (H) 12/23/2021   Lab Results  Component Value Date   VD25OH 32.5 12/01/2021     Review of Systems  Constitutional:  Negative for chills, fatigue and fever.  Respiratory:  Negative for chest tightness and shortness of breath.   Cardiovascular:  Negative for chest pain, palpitations and leg swelling.  Gastrointestinal:  Negative for blood in stool, diarrhea, nausea and vomiting.  Genitourinary:  Negative for dysuria, frequency and hematuria.  Neurological:  Negative for dizziness, light-headedness and headaches.  Psychiatric/Behavioral:  Negative for dysphoric mood and sleep disturbance. The patient is not nervous/anxious.    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 No pertinent surgical history.  Social History   Tobacco Use   Smoking status: Never  Smokeless tobacco: Never  Substance Use Topics   Alcohol use: No   Drug use: No     Medication list has been reviewed and updated.  Current Meds  Medication Sig   metoprolol tartrate (LOPRESSOR) 25 MG tablet TAKE 1 TABLET(25 MG) BY MOUTH DAILY   naproxen sodium (ALEVE) 220 MG tablet Take 220 mg by mouth 2 (two) times daily as needed.    PHQ 2/9 Scores 12/27/2021 12/01/2021 04/21/2021 10/30/2020  PHQ - 2 Score 3 0 0 0  PHQ- 9 Score 4 6 0 0    GAD 7 : Generalized Anxiety Score 12/27/2021 12/01/2021 04/21/2021 10/30/2020  Nervous, Anxious, on Edge 0 0 0 0  Control/stop worrying 0 0 0 0  Worry too much - different things 0 0 0 0  Trouble relaxing 0 0 0 0  Restless 0 0 0 0  Easily annoyed or irritable 1 2 0 0  Afraid - awful might happen 0 0 0 0  Total GAD 7 Score 1 2 0 0  Anxiety Difficulty Not difficult at all Somewhat difficult Not difficult at all -    BP Readings from Last 3 Encounters:  12/27/21 124/78  12/23/21 134/69  12/01/21  122/68    Physical Exam Constitutional:      Appearance: Normal appearance.  HENT:     Head: Normocephalic.     Comments: Bruise healing above left eye Cardiovascular:     Rate and Rhythm: Normal rate and regular rhythm.  Pulmonary:     Effort: Pulmonary effort is normal.     Breath sounds: Normal breath sounds. No wheezing or rhonchi.  Musculoskeletal:     Cervical back: Normal range of motion.     Right lower leg: No edema.     Left lower leg: No edema.  Lymphadenopathy:     Cervical: No cervical adenopathy.  Skin:    General: Skin is warm and dry.     Findings: Erythema (faint erythema right buttock c/w resolving stage 1) present.  Neurological:     Mental Status: He is alert and oriented to person, place, and time.     Gait: Gait abnormal (due to foot drop).  Psychiatric:        Mood and Affect: Mood normal.        Behavior: Behavior normal.    Wt Readings from Last 3 Encounters:  12/27/21 172 lb (78 kg)  12/19/21 166 lb (75.3 kg)  12/01/21 166 lb (75.3 kg)    BP 124/78    Pulse (!) 101    Ht 6' (1.829 m)    Wt 172 lb (78 kg)    SpO2 95%    BMI 23.33 kg/m   Assessment and Plan: 1. Acute metabolic encephalopathy Symptoms have resolved - pt is back to baseline.  2. Acute kidney injury superimposed on chronic kidney disease ([place]) Recheck labs - Comprehensive metabolic panel  3. Elevated LFTs Repeated today - Comprehensive metabolic panel  4. Essential hypertension Clinically stable exam with well controlled BP on metoprolol alone Tolerating medications without side effects at this time. Pt to continue current regimen and low sodium diet; benefits of regular exercise as able discussed.  5. Pressure injury of buttock, stage 1, unspecified laterality Resolving.  6. Thrombocytopenia ([place]) This was improving prior to discharge. - CBC with Differential/Platelet  7. Hypomagnesemia - Magnesium   Partially dictated using Editor, commissioning. Any errors are  unintentional.  Halina Maidens, MD [facility]  12/27/2021

## 2021-12-28 LAB — CBC WITH DIFFERENTIAL/PLATELET
Basophils Absolute: 0.1 10*3/uL (ref 0.0–0.2)
Basos: 1 %
EOS (ABSOLUTE): 0.2 10*3/uL (ref 0.0–0.4)
Eos: 2 %
Hematocrit: 41.9 % (ref 37.5–51.0)
Hemoglobin: 14.1 g/dL (ref 13.0–17.7)
Immature Grans (Abs): 0.3 10*3/uL — ABNORMAL HIGH (ref 0.0–0.1)
Immature Granulocytes: 3 %
Lymphocytes Absolute: 1 10*3/uL (ref 0.7–3.1)
Lymphs: 9 %
MCH: 28 pg (ref 26.6–33.0)
MCHC: 33.7 g/dL (ref 31.5–35.7)
MCV: 83 fL (ref 79–97)
Monocytes Absolute: 0.9 10*3/uL (ref 0.1–0.9)
Monocytes: 8 %
Neutrophils Absolute: 8.4 10*3/uL — ABNORMAL HIGH (ref 1.4–7.0)
Neutrophils: 77 %
Platelets: 341 10*3/uL (ref 150–450)
RBC: 5.04 x10E6/uL (ref 4.14–5.80)
RDW: 13 % (ref 11.6–15.4)
WBC: 10.7 10*3/uL (ref 3.4–10.8)

## 2021-12-28 LAB — COMPREHENSIVE METABOLIC PANEL
ALT: 101 IU/L — ABNORMAL HIGH (ref 0–44)
AST: 37 IU/L (ref 0–40)
Albumin/Globulin Ratio: 1.8 (ref 1.2–2.2)
Albumin: 3.8 g/dL (ref 3.7–4.7)
Alkaline Phosphatase: 174 IU/L — ABNORMAL HIGH (ref 44–121)
BUN/Creatinine Ratio: 14 (ref 10–24)
BUN: 19 mg/dL (ref 8–27)
Bilirubin Total: 0.9 mg/dL (ref 0.0–1.2)
CO2: 22 mmol/L (ref 20–29)
Calcium: 10.3 mg/dL — ABNORMAL HIGH (ref 8.6–10.2)
Chloride: 105 mmol/L (ref 96–106)
Creatinine, Ser: 1.39 mg/dL — ABNORMAL HIGH (ref 0.76–1.27)
Globulin, Total: 2.1 g/dL (ref 1.5–4.5)
Glucose: 101 mg/dL — ABNORMAL HIGH (ref 70–99)
Potassium: 4.2 mmol/L (ref 3.5–5.2)
Sodium: 141 mmol/L (ref 134–144)
Total Protein: 5.9 g/dL — ABNORMAL LOW (ref 6.0–8.5)
eGFR: 54 mL/min/{1.73_m2} — ABNORMAL LOW (ref >59)

## 2021-12-28 LAB — MAGNESIUM: Magnesium: 1.9 mg/dL (ref 1.6–2.3)

## 2022-01-17 ENCOUNTER — Other Ambulatory Visit: Payer: Self-pay | Admitting: Internal Medicine

## 2022-01-17 ENCOUNTER — Encounter: Payer: Self-pay | Admitting: Internal Medicine

## 2022-01-17 DIAGNOSIS — G3184 Mild cognitive impairment, so stated: Secondary | ICD-10-CM

## 2022-01-18 ENCOUNTER — Other Ambulatory Visit: Payer: Self-pay | Admitting: Internal Medicine

## 2022-02-22 IMAGING — CR DG LUMBAR SPINE COMPLETE 4+V
5 series · 5 of 5 positions shown · non-contrast
Comparison: CT abdomen and pelvis 02/17/2015.

CLINICAL DATA: Intermittent low back and left hip pain for 1 week.
No known injury.

EXAM:
LUMBAR SPINE - COMPLETE 4+ VIEW

[l-spine ap]
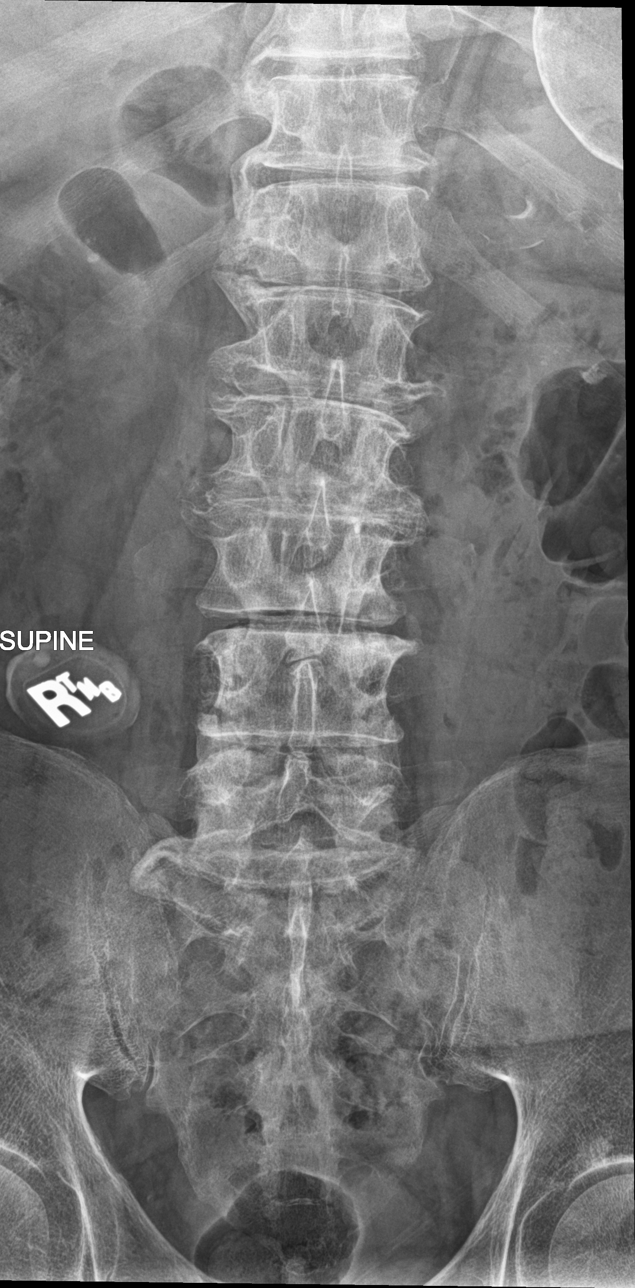

[l-spine obl (1 of 2)]
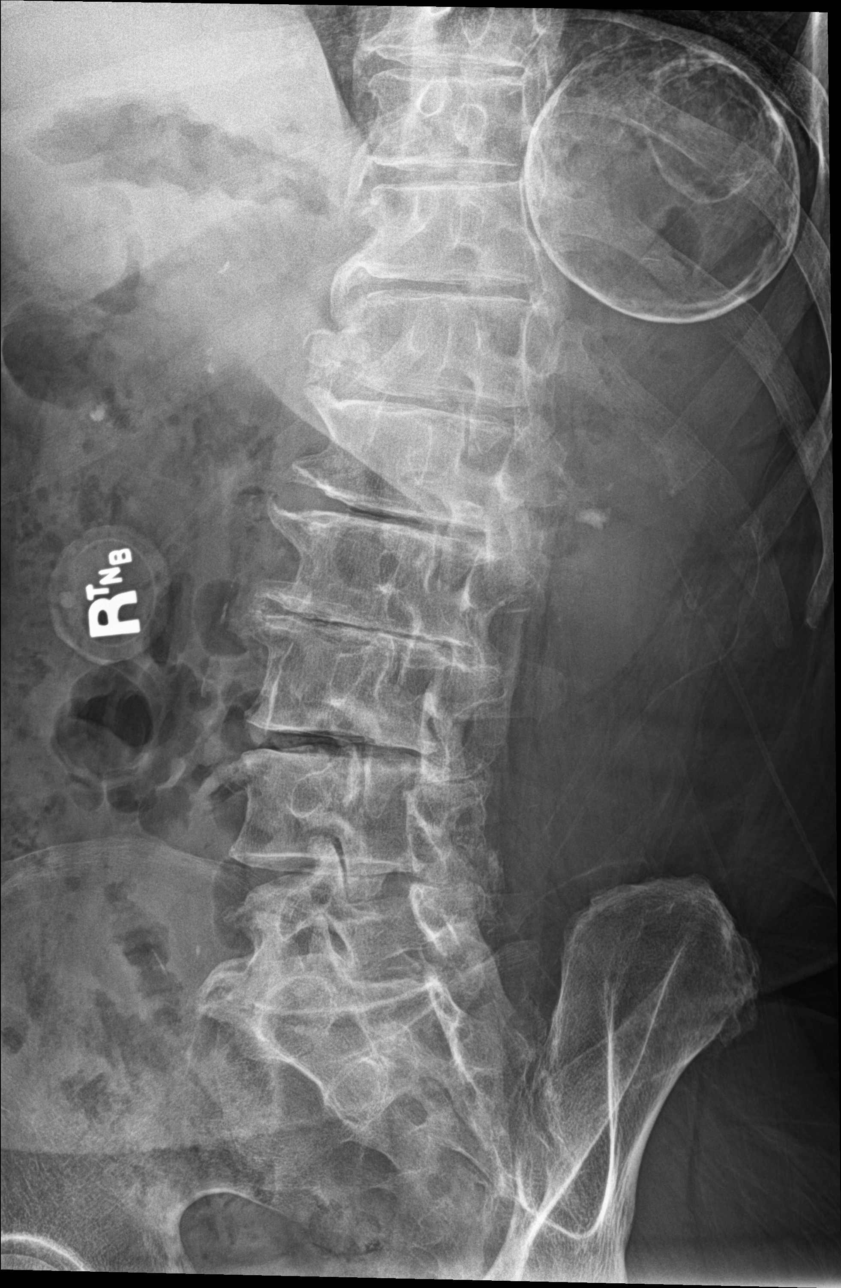

[l-spine obl (2 of 2)]
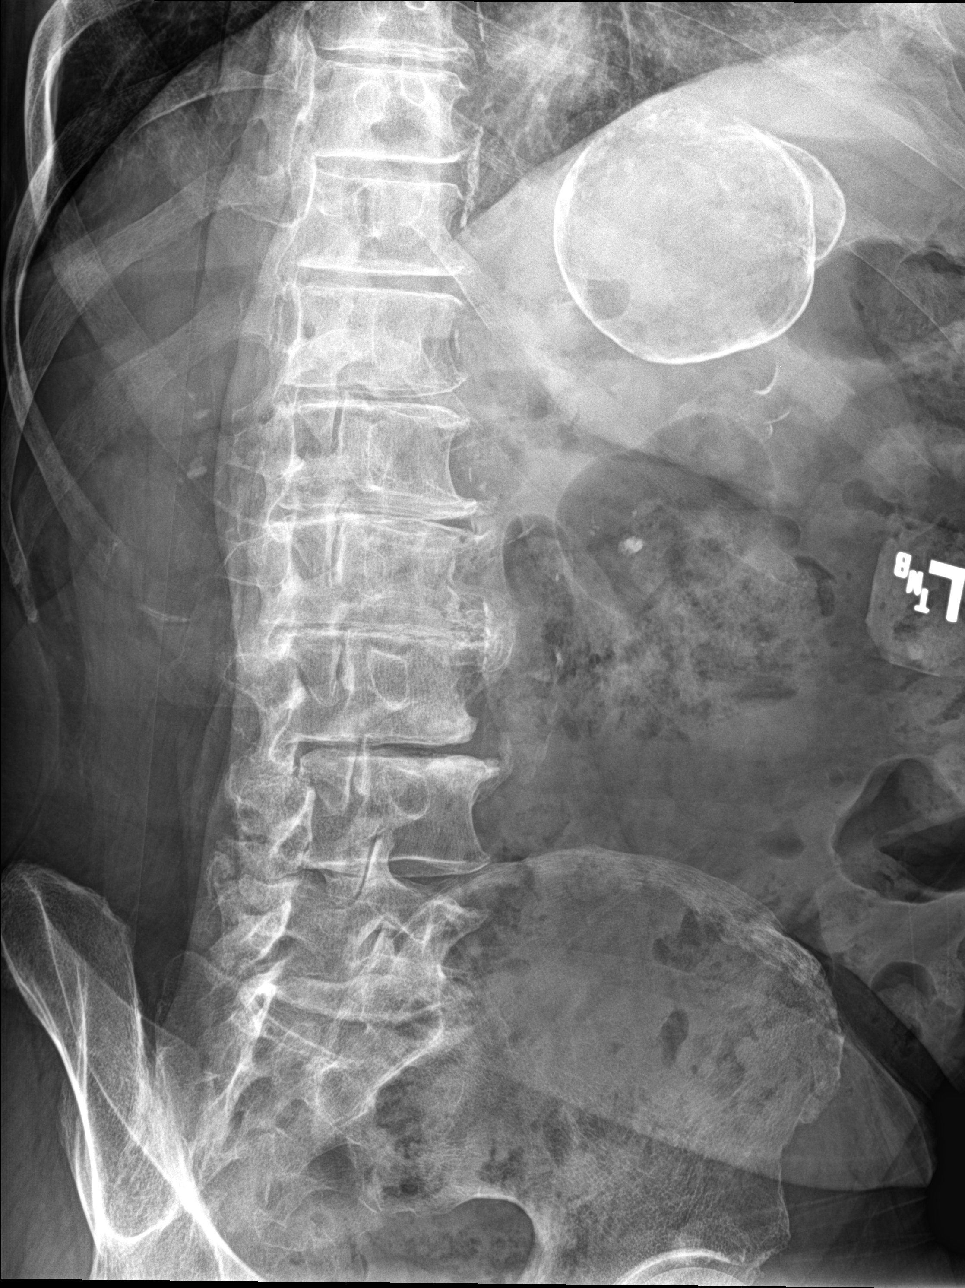

[l-spine lat]
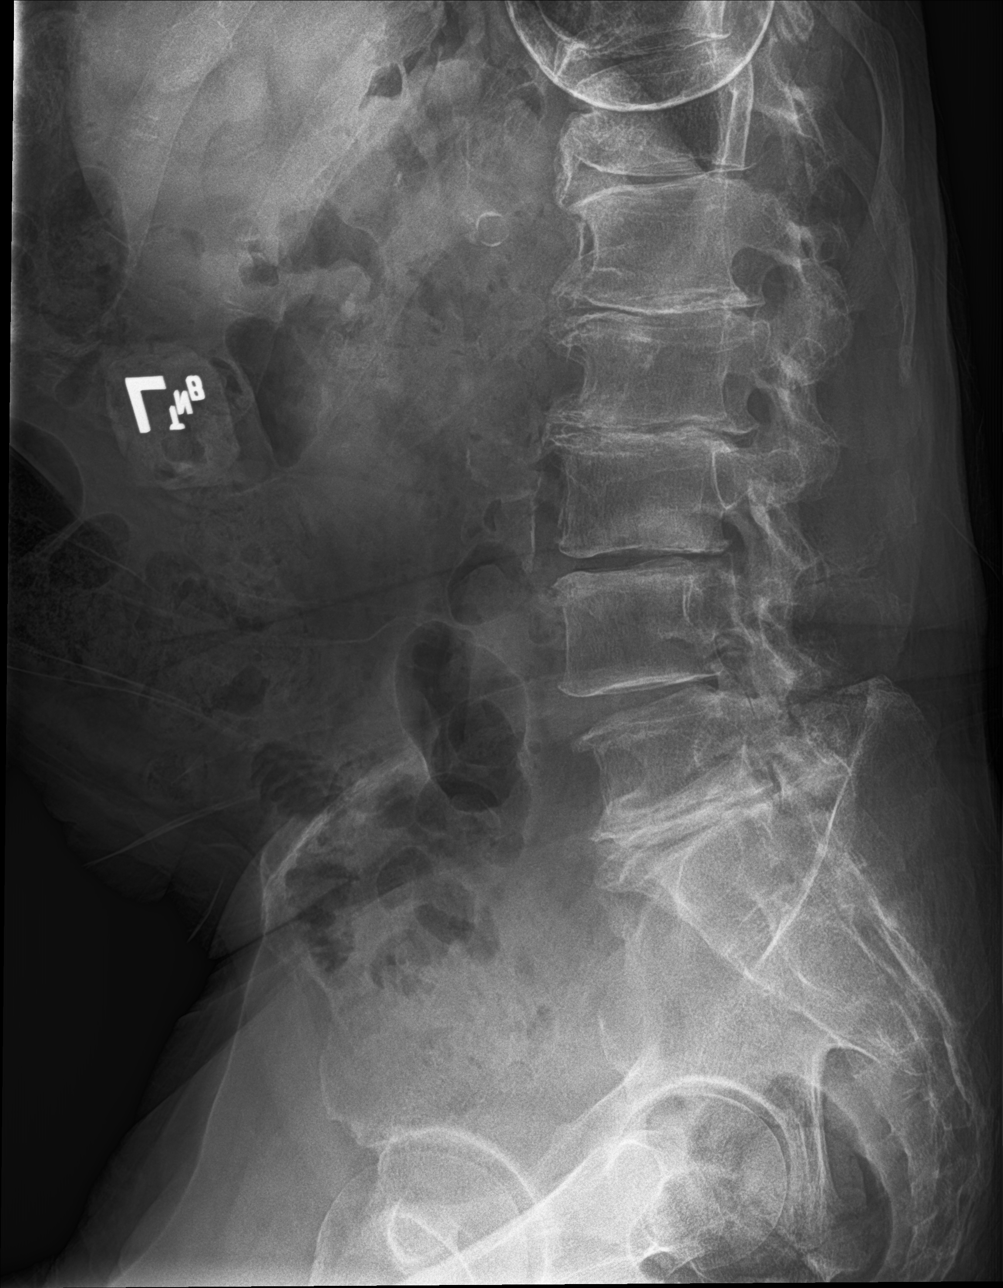

[l-spine spot]
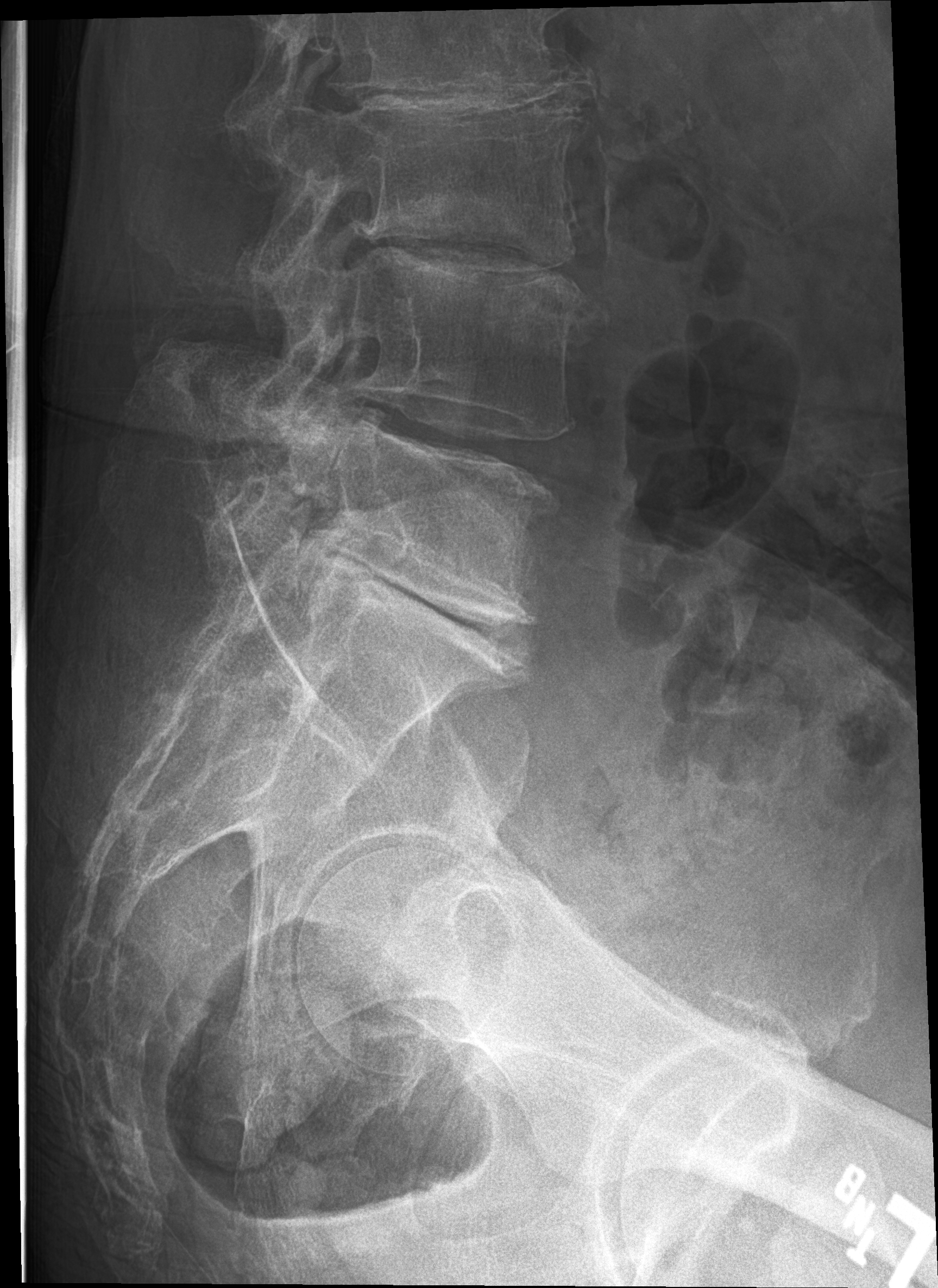

[5 of 5 positions shown; findings below may reference images not displayed]

FINDINGS: There is no fracture. The patient has severe multilevel degenerative
disc disease. Loss of disc space height and endplate spurring are
worst at L1-2, L2-3 and L5-S1. Multilevel facet degenerative change
is present. Paraspinous structures demonstrate aortic
atherosclerosis, small bilateral renal stones and a large calcified
cyst in the spleen prior as seen on the prior CT.
IMPRESSION: Advanced multilevel degenerative change.

No acute abnormality.

Large calcified splenic cyst and small bilateral renal stones as
seen on the patient's 1370 CT scan.

Aortic Atherosclerosis (PLOHA-LZY.[id number]).

## 2022-02-24 DIAGNOSIS — H25013 Cortical age-related cataract, bilateral: Secondary | ICD-10-CM | POA: Diagnosis not present

## 2022-03-01 ENCOUNTER — Encounter: Payer: Self-pay | Admitting: Internal Medicine

## 2022-04-06 DIAGNOSIS — R2689 Other abnormalities of gait and mobility: Secondary | ICD-10-CM | POA: Diagnosis not present

## 2022-04-06 DIAGNOSIS — R29818 Other symptoms and signs involving the nervous system: Secondary | ICD-10-CM | POA: Diagnosis not present

## 2022-04-06 DIAGNOSIS — Z7689 Persons encountering health services in other specified circumstances: Secondary | ICD-10-CM | POA: Diagnosis not present

## 2022-04-06 DIAGNOSIS — M21371 Foot drop, right foot: Secondary | ICD-10-CM | POA: Diagnosis not present

## 2022-04-06 DIAGNOSIS — R4189 Other symptoms and signs involving cognitive functions and awareness: Secondary | ICD-10-CM | POA: Diagnosis not present

## 2022-06-06 ENCOUNTER — Telehealth: Payer: Self-pay | Admitting: Internal Medicine

## 2022-06-06 DIAGNOSIS — R4189 Other symptoms and signs involving cognitive functions and awareness: Secondary | ICD-10-CM | POA: Diagnosis not present

## 2022-06-06 DIAGNOSIS — F419 Anxiety disorder, unspecified: Secondary | ICD-10-CM | POA: Diagnosis not present

## 2022-06-06 DIAGNOSIS — R29818 Other symptoms and signs involving the nervous system: Secondary | ICD-10-CM | POA: Diagnosis not present

## 2022-06-06 DIAGNOSIS — M21371 Foot drop, right foot: Secondary | ICD-10-CM | POA: Diagnosis not present

## 2022-06-06 DIAGNOSIS — R2689 Other abnormalities of gait and mobility: Secondary | ICD-10-CM | POA: Diagnosis not present

## 2022-06-06 NOTE — Telephone Encounter (Signed)
Copied from [place] [id number]. Topic: Medicare AWV >> Jun 06, 2022 10:36 AM Jae Dire wrote: Reason for CRM:  No answer unable to leave a message for patient to call back and schedule Medicare Annual Wellness Visit (AWV) in office.   If unable to come into the office for AWV,  please offer to do virtually or by telephone.  No hx of AWV eligible for AWVI per [employer] as of 08/17/2014  Please schedule at anytime with [facility] Health Advisor.      45 minute appointment   Any questions, please call me at [phone]

## 2022-06-29 ENCOUNTER — Ambulatory Visit: Payer: PPO | Admitting: Internal Medicine

## 2022-07-04 ENCOUNTER — Encounter: Payer: Self-pay | Admitting: Internal Medicine

## 2022-08-17 ENCOUNTER — Telehealth: Payer: Self-pay | Admitting: Internal Medicine

## 2022-08-17 NOTE — Telephone Encounter (Signed)
Copied from [place] [id number]. Topic: Medicare AWV >> Aug 17, 2022 10:34 AM Jae Dire wrote: Reason for CRM:  No answer unable to leave a message for patient to call back and schedule Medicare Annual Wellness Visit (AWV) in office.   If unable to come into the office for AWV,  please offer to do virtually or by telephone.  No hx of AWV eligible for AWVI per [employer] as of  08/17/2014  Please schedule at any time with [facility] Advisor.      45 minute appointment   Any questions, please call me at [phone]

## 2022-10-04 ENCOUNTER — Telehealth: Payer: Self-pay | Admitting: Internal Medicine

## 2022-10-04 NOTE — Telephone Encounter (Signed)
Copied from [place] [id number]. Topic: Medicare AWV >> Oct 04, 2022  1:57 PM Devoria Glassing wrote: Reason for CRM: Attempted to schedule AWV. Unable to LVM.  Will try at later time.

## 2022-11-17 ENCOUNTER — Telehealth: Payer: Self-pay | Admitting: Internal Medicine

## 2022-11-17 NOTE — Telephone Encounter (Signed)
Spoke with patient spouse she stated call him back

## 2022-11-18 ENCOUNTER — Encounter: Payer: Self-pay | Admitting: Emergency Medicine

## 2022-11-18 ENCOUNTER — Emergency Department: Payer: PPO

## 2022-11-18 ENCOUNTER — Telehealth: Payer: Self-pay | Admitting: Internal Medicine

## 2022-11-18 ENCOUNTER — Other Ambulatory Visit: Payer: Self-pay

## 2022-11-18 ENCOUNTER — Emergency Department
Admission: EM | Admit: 2022-11-18 | Discharge: 2022-11-18 | Disposition: A | Discharge status: Home / Self Care | Payer: PPO | Attending: Emergency Medicine | Admitting: Emergency Medicine

## 2022-11-18 DIAGNOSIS — S01412A Laceration without foreign body of left cheek and temporomandibular area, initial encounter: Secondary | ICD-10-CM | POA: Insufficient documentation

## 2022-11-18 DIAGNOSIS — R000 Tachycardia, unspecified: Secondary | ICD-10-CM | POA: Diagnosis not present

## 2022-11-18 DIAGNOSIS — S0990XA Unspecified injury of head, initial encounter: Secondary | ICD-10-CM

## 2022-11-18 DIAGNOSIS — F0390 Unspecified dementia without behavioral disturbance: Secondary | ICD-10-CM | POA: Insufficient documentation

## 2022-11-18 DIAGNOSIS — S40811A Abrasion of right upper arm, initial encounter: Secondary | ICD-10-CM | POA: Insufficient documentation

## 2022-11-18 DIAGNOSIS — I10 Essential (primary) hypertension: Secondary | ICD-10-CM | POA: Insufficient documentation

## 2022-11-18 DIAGNOSIS — S40819A Abrasion of unspecified upper arm, initial encounter: Secondary | ICD-10-CM | POA: Diagnosis not present

## 2022-11-18 DIAGNOSIS — S0181XA Laceration without foreign body of other part of head, initial encounter: Secondary | ICD-10-CM

## 2022-11-18 DIAGNOSIS — W01198A Fall on same level from slipping, tripping and stumbling with subsequent striking against other object, initial encounter: Secondary | ICD-10-CM | POA: Insufficient documentation

## 2022-11-18 DIAGNOSIS — T07XXXA Unspecified multiple injuries, initial encounter: Secondary | ICD-10-CM

## 2022-11-18 DIAGNOSIS — S80811A Abrasion, right lower leg, initial encounter: Secondary | ICD-10-CM | POA: Insufficient documentation

## 2022-11-18 DIAGNOSIS — S199XXA Unspecified injury of neck, initial encounter: Secondary | ICD-10-CM | POA: Diagnosis not present

## 2022-11-18 DIAGNOSIS — S0993XA Unspecified injury of face, initial encounter: Secondary | ICD-10-CM | POA: Diagnosis not present

## 2022-11-18 DIAGNOSIS — S80812A Abrasion, left lower leg, initial encounter: Secondary | ICD-10-CM | POA: Diagnosis not present

## 2022-11-18 DIAGNOSIS — S098XXA Other specified injuries of head, initial encounter: Secondary | ICD-10-CM | POA: Diagnosis present

## 2022-11-18 DIAGNOSIS — S40812A Abrasion of left upper arm, initial encounter: Secondary | ICD-10-CM | POA: Insufficient documentation

## 2022-11-18 DIAGNOSIS — I6782 Cerebral ischemia: Secondary | ICD-10-CM | POA: Diagnosis not present

## 2022-11-18 DIAGNOSIS — W19XXXA Unspecified fall, initial encounter: Secondary | ICD-10-CM | POA: Diagnosis not present

## 2022-11-18 DIAGNOSIS — K029 Dental caries, unspecified: Secondary | ICD-10-CM | POA: Diagnosis not present

## 2022-11-18 DIAGNOSIS — I639 Cerebral infarction, unspecified: Secondary | ICD-10-CM | POA: Diagnosis not present

## 2022-11-18 DIAGNOSIS — R58 Hemorrhage, not elsewhere classified: Secondary | ICD-10-CM | POA: Diagnosis not present

## 2022-11-18 DIAGNOSIS — S80819A Abrasion, unspecified lower leg, initial encounter: Secondary | ICD-10-CM | POA: Diagnosis not present

## 2022-11-18 MED ORDER — LIDOCAINE HCL (PF) 1 % IJ SOLN
5.0000 mL | Freq: Once | INTRAMUSCULAR | Status: AC
Start: 2022-11-18 — End: 2022-11-18
  Administered 2022-11-18: 5 mL via INTRADERMAL
  Filled 2022-11-18: qty 5

## 2022-11-18 NOTE — ED Provider Notes (Signed)
[facility] Provider Note    Event Date/Time   First MD Initiated Contact with Patient 11/18/22 1144     (approximate)   History   Fall   HPI  Alex Hughes is a 77 y.o. male  with history of hypertension, dementia and as listed in EMR presents to the emergency department for evaluation after falling into the bushes while trying to walk up to the mailbox. He states that his feet got tangled in the bushes and he fell every time he tried to stand up and move his feet.  He has abrasions over the upper and lower extremities and laceration under the right eye.[name] He denies headache, neck or back pain. He states his tdap is current.      Physical Exam   Triage Vital Signs: ED Triage Vitals  Enc Vitals Group     BP 11/18/22 1138 (!) 170/102     Pulse Rate 11/18/22 1138 100     Resp 11/18/22 1138 16     Temp 11/18/22 1138 98.6 F (37 C)     Temp Source 11/18/22 1138 Oral     SpO2 11/18/22 1138 97 %     Weight 11/18/22 1138 170 lb (77.1 kg)     Height 11/18/22 1138 6' (1.829 m)     Head Circumference --      Peak Flow --      Pain Score 11/18/22 1141 2     Pain Loc --      Pain Edu? --      Excl. in [place]? --     Most recent vital signs: Vitals:   11/18/22 1138  BP: (!) 170/102  Pulse: 100  Resp: 16  Temp: 98.6 F (37 C)  SpO2: 97%    General: Awake, no distress.  CV:  Good peripheral perfusion.  Resp:  Normal effort.  Abd:  No distention.  Other:  Laceration under the left eye. Abrasions diffuse over upper and lower extremities.   ED Results / Procedures / Treatments   Labs (all labs ordered are listed, but only abnormal results are displayed) Labs Reviewed - No data to display   EKG  Not indicated.   RADIOLOGY  Image and radiology report reviewed and interpreted by me. Radiology report consistent with the same.  CT head, cervical spine, and maxillofacial bones negative for acute concerns.  PROCEDURES:  Critical Care  performed: No  ..Laceration Repair  Date/Time: 11/18/2022 12:59 PM  Performed by: Victorino Dike, FNP Authorized by: Victorino Dike, FNP   Consent:    Consent obtained:  Verbal   Consent given by:  Parent   Risks discussed:  Pain and poor cosmetic result Universal protocol:    Patient identity confirmed:  Verbally with patient Anesthesia:    Anesthesia method:  Local infiltration   Local anesthetic:  Lidocaine 1% w/o epi Laceration details:    Location:  Face   Face location:  L cheek   Length (cm):  3 Pre-procedure details:    Preparation:  Patient was prepped and draped in usual sterile fashion Treatment:    Area cleansed with:  Chlorhexidine and saline   Amount of cleaning:  Standard   Irrigation method:  Tap   Debridement:  Minimal Skin repair:    Repair method:  Sutures   Suture size:  6-0   Suture material:  Prolene   Suture technique:  Simple interrupted   Number of sutures:  3 Approximation:  Approximation:  Close Repair type:    Repair type:  Simple Post-procedure details:    Dressing:  Open (no dressing)   Procedure completion:  Tolerated well, no immediate complications    MEDICATIONS ORDERED IN ED:  Medications  lidocaine (PF) (XYLOCAINE) 1 % injection 5 mL (5 mLs Intradermal Given by Other 11/18/22 1251)     IMPRESSION / MDM / ASSESSMENT AND PLAN / ED COURSE   I have reviewed the triage note.  Differential diagnosis includes, but is not limited to, SDH, ICH, facial bone fracture, multiple abrasions  Patient's presentation is most consistent with acute presentation with potential threat to life or bodily function.  77 year old male presents to the emergency department for treatment and evaluation after walking across the road and falling into some briers/bushes. He has a laceration under left eye and abrasions over upper and lower extremities. All bleeding controlled.  CTs are all reassuring. Daughter is at bedside and states that he is at  his cognitive baseline.   Laceration repaired as described above. Patient tolerated procedure well.  Home care instructions discussed with daughter.      FINAL CLINICAL IMPRESSION(S) / ED DIAGNOSES   Final diagnoses:  Minor head injury, initial encounter  Facial laceration, initial encounter  Abrasions of multiple sites     Rx / DC Orders   ED Discharge Orders     None        Note:  This document was prepared using Dragon voice recognition software and may include unintentional dictation errors.   Victorino Dike, FNP 11/18/22 1315    Vanessa LaMoure, MD 11/18/22 [phone]

## 2022-11-18 NOTE — Telephone Encounter (Signed)
Spoke to [place] let her know that the discharge paper stated to see Dr. Army Melia in 1 week which is 2/9. Scheduled pt for 2/9 she verbalized understanding.  KP

## 2022-11-18 NOTE — ED Triage Notes (Signed)
Presents Via EMS form home  States he stumbled  and fell going to mailbox  Pt has several superficial scratches to both legs  Laceration noted under left eye

## 2022-11-18 NOTE — Telephone Encounter (Signed)
Copied from [place] [id number]. Topic: Appointment Scheduling - Scheduling Inquiry for Clinic >> Nov 18, 2022  4:11 PM Erskine Squibb wrote: Reason for CRM: The daughter of the patient, Candace called in stating her father fell and had to get stitches near his left eye at [facility]. He has been scheduled for the soonest available appt to get his stitches removed which is on Wed 2-07 but the daughter was under the impression he should get them removed sooner. Please assist further

## 2022-11-23 ENCOUNTER — Ambulatory Visit: Payer: PPO | Admitting: Internal Medicine

## 2022-11-25 ENCOUNTER — Encounter: Payer: Self-pay | Admitting: Internal Medicine

## 2022-11-25 ENCOUNTER — Ambulatory Visit (INDEPENDENT_AMBULATORY_CARE_PROVIDER_SITE_OTHER): Payer: PPO | Admitting: Internal Medicine

## 2022-11-25 VITALS — BP 132/90 | HR 56 | Ht 72.0 in | Wt 163.0 lb

## 2022-11-25 DIAGNOSIS — H6123 Impacted cerumen, bilateral: Secondary | ICD-10-CM | POA: Diagnosis not present

## 2022-11-25 DIAGNOSIS — I10 Essential (primary) hypertension: Secondary | ICD-10-CM | POA: Diagnosis not present

## 2022-11-25 DIAGNOSIS — S0181XA Laceration without foreign body of other part of head, initial encounter: Secondary | ICD-10-CM | POA: Diagnosis not present

## 2022-11-25 MED ORDER — METOPROLOL TARTRATE 25 MG PO TABS: ORAL_TABLET | ORAL | 1 refills | Status: DC

## 2022-11-25 NOTE — Progress Notes (Signed)
Date:  11/25/2022   Name:  Alex Hughes   DOB:  1946/07/24   MRN:  [id number]   Chief Complaint: Suture / Staple Removal (Right under eye, maybe 3 stitches /)  Ear Fullness  There is pain in both ears. This is a chronic problem. The problem occurs constantly. There has been no fever. The patient is experiencing no pain. Associated symptoms include hearing loss. Pertinent negatives include no abdominal pain, coughing, diarrhea or headaches.   Patient sustained a fall and facial laceration on 11/18/22.  Three sutures were placed in the ED under the left eye.  He feels well, the laceration is not painful and has healed.  Tetanus vaccine is up to date.  Lab Results  Component Value Date   NA 141 12/27/2021   K 4.2 12/27/2021   CO2 22 12/27/2021   GLUCOSE 101 (H) 12/27/2021   BUN 19 12/27/2021   CREATININE 1.39 (H) 12/27/2021   CALCIUM 10.3 (H) 12/27/2021   EGFR 54 (L) 12/27/2021   GFRNONAA 51 (L) 12/23/2021   Lab Results  Component Value Date   CHOL 173 12/01/2021   HDL 45 12/01/2021   LDLCALC 106 (H) 12/01/2021   TRIG 123 12/01/2021   CHOLHDL 3.8 12/01/2021   Lab Results  Component Value Date   TSH 0.269 (L) 12/20/2021   Lab Results  Component Value Date   HGBA1C 5.3 12/01/2021   Lab Results  Component Value Date   WBC 10.7 12/27/2021   HGB 14.1 12/27/2021   HCT 41.9 12/27/2021   MCV 83 12/27/2021   PLT 341 12/27/2021   Lab Results  Component Value Date   ALT 101 (H) 12/27/2021   AST 37 12/27/2021   GGT 218 (H) 12/19/2021   ALKPHOS 174 (H) 12/27/2021   BILITOT 0.9 12/27/2021   Lab Results  Component Value Date   VD25OH 32.5 12/01/2021     Review of Systems  Constitutional:  Negative for fatigue and unexpected weight change.  HENT:  Positive for hearing loss. Negative for nosebleeds.   Eyes:  Negative for visual disturbance.  Respiratory:  Negative for cough, chest tightness, shortness of breath and wheezing.   Cardiovascular:  Negative for chest pain,  palpitations and leg swelling.  Gastrointestinal:  Negative for abdominal pain, constipation and diarrhea.  Skin:  Positive for wound.  Neurological:  Negative for dizziness, weakness, light-headedness and headaches.    Patient Active Problem List   Diagnosis Date Noted   Low TSH level 12/22/2021   Thrombocytopenia ([place]) 12/22/2021   Acute liver failure without hepatic coma [id number]   Acute metabolic encephalopathy Q000111Q   Pressure injury of skin 12/20/2021   Acute kidney injury superimposed on chronic kidney disease ([place]) 12/19/2021   Bacteriuria 12/19/2021   Basal cell carcinoma of chest wall 12/01/2021   Gout 12/01/2021   MCI (mild cognitive impairment) with memory loss 12/01/2021   Primary hyperparathyroidism ([place]) 04/21/2020   Vitamin D deficiency 04/21/2020   Hypercalcemia 06/20/2018   Right foot drop 05/11/2017   Right lumbar radiculopathy 11/30/2016   Tinnitus 11/30/2016   Essential hypertension 11/30/2016   Overweight (BMI 25.0-29.9) 11/30/2016    Allergies  Allergen Reactions   Lisinopril Swelling    Facial Swelling    Elemental Sulfur Other (See Comments)    Urinated blood   Sulfa Antibiotics Rash    History reviewed. No pertinent surgical history.  Social History   Tobacco Use   Smoking status: Never   Smokeless tobacco: Never  Substance  Use Topics   Alcohol use: No   Drug use: No     Medication list has been reviewed and updated.  Current Meds  Medication Sig   naproxen sodium (ALEVE) 220 MG tablet Take 220 mg by mouth 2 (two) times daily as needed.   [DISCONTINUED] metoprolol tartrate (LOPRESSOR) 25 MG tablet TAKE 1 TABLET(25 MG) BY MOUTH DAILY       11/25/2022    2:04 PM 12/27/2021    1:55 PM 12/01/2021    8:14 AM 04/21/2021   11:24 AM  GAD 7 : Generalized Anxiety Score  Nervous, Anxious, on Edge 0 0 0 0  Control/stop worrying 0 0 0 0  Worry too much - different things 0 0 0 0  Trouble relaxing 0 0 0 0  Restless 0 0 0 0  Easily  annoyed or irritable 1 1 2 $ 0  Afraid - awful might happen 0 0 0 0  Total GAD 7 Score 1 1 2 $ 0  Anxiety Difficulty Not difficult at all Not difficult at all Somewhat difficult Not difficult at all       11/25/2022    2:04 PM 12/27/2021    1:55 PM 12/01/2021    8:12 AM  Depression screen PHQ 2/9  Decreased Interest 0 2 0  Down, Depressed, Hopeless 0 1 0  PHQ - 2 Score 0 3 0  Altered sleeping 0 0 2  Tired, decreased energy 0 1 3  Change in appetite 0 0 0  Feeling bad or failure about yourself  0 0 0  Trouble concentrating 2 0 1  Moving slowly or fidgety/restless 0 0 0  Suicidal thoughts 0 0 0  PHQ-9 Score 2 4 6  $ Difficult doing work/chores Not difficult at all Not difficult at all Somewhat difficult    BP Readings from Last 3 Encounters:  11/25/22 (!) 132/90  11/18/22 (!) 168/99  12/27/21 124/78    Physical Exam Vitals and nursing note reviewed.  Constitutional:      General: He is not in acute distress.    Appearance: He is well-developed.  HENT:     Head: Normocephalic and atraumatic.     Right Ear: There is impacted cerumen.     Left Ear: There is impacted cerumen.  Eyes:      Comments: Three interrupted sutures below left eye - removed Laceration healed well  Cardiovascular:     Rate and Rhythm: Normal rate and regular rhythm.  Pulmonary:     Effort: Pulmonary effort is normal. No respiratory distress.     Breath sounds: Normal breath sounds.  Skin:    General: Skin is warm and dry.     Findings: No rash.  Neurological:     Mental Status: He is alert and oriented to person, place, and time.  Psychiatric:        Mood and Affect: Mood normal.        Behavior: Behavior normal.     Wt Readings from Last 3 Encounters:  11/25/22 163 lb (73.9 kg)  11/18/22 170 lb (77.1 kg)  12/27/21 172 lb (78 kg)    BP (!) 132/90   Pulse (!) 56   Ht 6' (1.829 m)   Wt 163 lb (73.9 kg)   SpO2 95%   BMI 22.11 kg/m   Assessment and Plan: Problem List Items Addressed This  Visit       Cardiovascular and Mediastinum   Essential hypertension    Clinically stable exam with well controlled BP  on metoprolol. Tolerating medications without side effects. Pt to continue current regimen and low sodium diet.       Relevant Medications   metoprolol tartrate (LOPRESSOR) 25 MG tablet   Other Visit Diagnoses     Facial laceration, initial encounter    -  Primary   three sutures removed laceration well healed   Impacted cerumen of both ears       recommend ENT for cerumen removal if symptoms persist        Partially dictated using Dragon software. Any errors are unintentional.  Halina Maidens, MD [facility]  11/25/2022

## 2022-11-25 NOTE — Assessment & Plan Note (Signed)
Clinically stable exam with well controlled BP on metoprolol. Tolerating medications without side effects. Pt to continue current regimen and low sodium diet.

## 2022-11-28 ENCOUNTER — Telehealth: Payer: Self-pay | Admitting: Internal Medicine

## 2022-11-28 NOTE — Telephone Encounter (Signed)
Spoke with patient's spouse to sched his AWV he was not available she stated to call Candice her daughter to sched AWV she works for [employer].

## 2022-11-30 ENCOUNTER — Telehealth: Payer: Self-pay | Admitting: Internal Medicine

## 2022-11-30 NOTE — Telephone Encounter (Signed)
Copied from [place] [id number]. Topic: Medicare AWV >> Nov 30, 2022 11:13 AM Devoria Glassing wrote: Reason for CRM: Called patient to schedule Medicare Annual Wellness Visit (AWV) with [place], [place]  Appointment can be an offiice/telephone or virtual visit;  Please call [phone] ask for Juliann Pulse.

## 2022-12-28 ENCOUNTER — Telehealth: Payer: Self-pay

## 2022-12-28 NOTE — Telephone Encounter (Signed)
Called pt to schedule an AWV. Needs initial.  KP

## 2023-01-05 ENCOUNTER — Telehealth: Payer: Self-pay | Admitting: Internal Medicine

## 2023-01-05 NOTE — Telephone Encounter (Signed)
Copied from [place] [id number]. Topic: Medicare AWV >> Jan 05, 2023 11:55 AM Devoria Glassing wrote: Reason for CRM: Called patient to schedule Medicare Annual Wellness Visit (AWV). No voicemail available to leave a message.  Last date of AWV: NONE  Please schedule an appointment at any time with Kirke Shaggy, LPN after [id number] on AWV schedule. .  If any questions, please contact me.  Thank you ,  Sherol Dade; [place] Direct Dial: [phone]

## 2023-01-06 ENCOUNTER — Other Ambulatory Visit: Payer: Self-pay | Admitting: Internal Medicine

## 2023-01-06 DIAGNOSIS — I10 Essential (primary) hypertension: Secondary | ICD-10-CM

## 2023-01-06 DIAGNOSIS — H903 Sensorineural hearing loss, bilateral: Secondary | ICD-10-CM | POA: Diagnosis not present

## 2023-01-06 NOTE — Telephone Encounter (Signed)
Requested Prescriptions  Pending Prescriptions Disp Refills   metoprolol tartrate (LOPRESSOR) 25 MG tablet [Pharmacy Med Name: METOPROLOL TARTRATE 25MG  TABLETS] 90 tablet 1    Sig: TAKE 1 TABLET(25 MG) BY MOUTH DAILY     Cardiovascular:  Beta Blockers Failed - 01/06/2023  3:38 AM      Failed - Last BP in normal range    BP Readings from Last 1 Encounters:  11/25/22 (!) 132/90         Passed - Last Heart Rate in normal range    Pulse Readings from Last 1 Encounters:  11/25/22 (!) 56         Passed - Valid encounter within last 6 months    Recent Outpatient Visits           1 month ago Facial laceration, initial encounter   [place] at [facility], Jesse Sans, MD   1 year ago Acute metabolic encephalopathy    [place] at [facility], Jesse Sans, MD   1 year ago Essential hypertension   [place] at [facility], Jesse Sans, MD   1 year ago Acute hip pain, left   [facility] at [facility], Jesse Sans, MD   2 years ago Essential hypertension   [place] at [facility], Jesse Sans, MD

## 2023-05-12 ENCOUNTER — Telehealth: Payer: Self-pay | Admitting: Internal Medicine

## 2023-05-12 NOTE — Telephone Encounter (Signed)
Copied from CRM [id number]. Topic: Medicare AWV >> May 12, 2023  9:34 AM Payton Doughty wrote: Reason for CRM: LM 05/12/2023 to schedule AWV   Verlee Rossetti; Care Guide Ambulatory Clinical Support [facility] [facility] Direct Dial: [phone]

## 2023-05-29 ENCOUNTER — Telehealth: Payer: Self-pay | Admitting: *Deleted

## 2023-05-29 ENCOUNTER — Encounter: Payer: Self-pay | Admitting: *Deleted

## 2023-05-29 NOTE — Telephone Encounter (Signed)
I attempted to contact patient by telephone but was unsuccessful. According to the patient's chart they are due for follow up  with [facility]. I have left a HIPAA compliant message advising the patient to contact [facility]. I will continue to follow up with the patient to make sure this appointment is scheduled.

## 2023-05-31 ENCOUNTER — Ambulatory Visit (INDEPENDENT_AMBULATORY_CARE_PROVIDER_SITE_OTHER): Payer: PPO

## 2023-05-31 VITALS — Ht 72.0 in | Wt 175.0 lb

## 2023-05-31 DIAGNOSIS — Z0000 Encounter for general adult medical examination without abnormal findings: Secondary | ICD-10-CM

## 2023-05-31 NOTE — Patient Instructions (Signed)
Alex Hughes , Thank you for taking time to come for your Medicare Wellness Visit. I appreciate your ongoing commitment to your health goals. Please review the following plan we discussed and let me know if I can assist you in the future.   These are the goals we discussed:  Goals      Exercise 3x per week (30 min per time)     Exercise more        This is a list of the screening recommended for you and due dates:  Health Maintenance  Topic Date Due   COVID-19 Vaccine (3 - Pfizer risk series) 06/16/2023*   Zoster (Shingles) Vaccine (1 of 2) 08/31/2023*   Flu Shot  01/15/2024*   Colon Cancer Screening  05/30/2024*   Medicare Annual Wellness Visit  05/30/2024   DTaP/Tdap/Td vaccine (3 - Td or Tdap) 08/23/2025   Pneumonia Vaccine  Completed   Hepatitis C Screening  Completed   HPV Vaccine  Aged Out  *Topic was postponed. The date shown is not the original due date.   Health Maintenance After Age 73 After age 33, you are at a higher risk for certain long-term diseases and infections as well as injuries from falls. Falls are a major cause of broken bones and head injuries in people who are older than age 54. Getting regular preventive care can help to keep you healthy and well. Preventive care includes getting regular testing and making lifestyle changes as recommended by your health care provider. Talk with your health care provider about: Which screenings and tests you should have. A screening is a test that checks for a disease when you have no symptoms. A diet and exercise plan that is right for you. What should I know about screenings and tests to prevent falls? Screening and testing are the best ways to find a health problem early. Early diagnosis and treatment give you the best chance of managing medical conditions that are common after age 53. Certain conditions and lifestyle choices may make you more likely to have a fall. Your health care provider may recommend: Regular vision  checks. Poor vision and conditions such as cataracts can make you more likely to have a fall. If you wear glasses, make sure to get your prescription updated if your vision changes. Medicine review. Work with your health care provider to regularly review all of the medicines you are taking, including over-the-counter medicines. Ask your health care provider about any side effects that may make you more likely to have a fall. Tell your health care provider if any medicines that you take make you feel dizzy or sleepy. Strength and balance checks. Your health care provider may recommend certain tests to check your strength and balance while standing, walking, or changing positions. Foot health exam. Foot pain and numbness, as well as not wearing proper footwear, can make you more likely to have a fall. Screenings, including: Osteoporosis screening. Osteoporosis is a condition that causes the bones to get weaker and break more easily. Blood pressure screening. Blood pressure changes and medicines to control blood pressure can make you feel dizzy. Depression screening. You may be more likely to have a fall if you have a fear of falling, feel depressed, or feel unable to do activities that you used to do. Alcohol use screening. Using too much alcohol can affect your balance and may make you more likely to have a fall. Follow these instructions at home: Lifestyle Do not drink alcohol if: Your health  care provider tells you not to drink. If you drink alcohol: Limit how much you have to: 0-1 drink a day for women. 0-2 drinks a day for men. Know how much alcohol is in your drink. In the [place] one drink equals one 12 oz bottle of beer (355 mL), one 5 oz glass of wine (148 mL), or one 1 oz glass of hard liquor (44 mL). Do not use any products that contain nicotine or tobacco. These products include cigarettes, chewing tobacco, and vaping devices, such as e-cigarettes. If you need help quitting, ask your  health care provider. Activity  Follow a regular exercise program to stay fit. This will help you maintain your balance. Ask your health care provider what types of exercise are appropriate for you. If you need a cane or walker, use it as recommended by your health care provider. Wear supportive shoes that have nonskid soles. Safety  Remove any tripping hazards, such as rugs, cords, and clutter. Install safety equipment such as grab bars in bathrooms and safety rails on stairs. Keep rooms and walkways well-lit. General instructions Talk with your health care provider about your risks for falling. Tell your health care provider if: You fall. Be sure to tell your health care provider about all falls, even ones that seem minor. You feel dizzy, tiredness (fatigue), or off-balance. Take over-the-counter and prescription medicines only as told by your health care provider. These include supplements. Eat a healthy diet and maintain a healthy weight. A healthy diet includes low-fat dairy products, low-fat (lean) meats, and fiber from whole grains, beans, and lots of fruits and vegetables. Stay current with your vaccines. Schedule regular health, dental, and eye exams. Summary Having a healthy lifestyle and getting preventive care can help to protect your health and wellness after age 42. Screening and testing are the best way to find a health problem early and help you avoid having a fall. Early diagnosis and treatment give you the best chance for managing medical conditions that are more common for people who are older than age 71. Falls are a major cause of broken bones and head injuries in people who are older than age 71. Take precautions to prevent a fall at home. Work with your health care provider to learn what changes you can make to improve your health and wellness and to prevent falls. This information is not intended to replace advice given to you by your health care provider. Make sure  you discuss any questions you have with your health care provider. Document Revised: 02/22/2021 Document Reviewed: 02/22/2021 Elsevier Patient Education  2024 [employer].

## 2023-05-31 NOTE — Progress Notes (Signed)
Subjective:   Alex Hughes is a 77 y.o. male who presents for Medicare Annual/Subsequent preventive examination.  Visit Complete: Virtual  I connected with  Alex Hughes on 05/31/23 by a audio enabled telemedicine application and verified that I am speaking with the correct person using two identifiers.  Patient Location: Home  Provider Location: Office/Clinic  I discussed the limitations of evaluation and management by telemedicine. The patient expressed understanding and agreed to proceed.  Cardiac Risk Factors include: male gender;advanced age (>69men, >24 women);hypertension     Objective:    Today's Vitals   05/31/23 1017  Weight: 175 lb (79.4 kg)  Height: 6' (1.829 m)   Body mass index is 23.73 kg/m.     05/31/2023   10:25 AM 11/18/2022   11:41 AM 12/20/2021   10:00 AM 12/05/2016    2:21 PM 08/24/2015    7:38 AM 02/17/2015    8:39 AM  Advanced Directives  Does Patient Have a Medical Advance Directive? No No No No No No  Would patient like information on creating a medical advance directive? No - Patient declined No - Patient declined No - Patient declined       Current Medications (verified) Outpatient Encounter Medications as of 05/31/2023  Medication Sig   metoprolol tartrate (LOPRESSOR) 25 MG tablet TAKE 1 TABLET(25 MG) BY MOUTH DAILY   naproxen sodium (ALEVE) 220 MG tablet Take 220 mg by mouth 2 (two) times daily as needed.   No facility-administered encounter medications on file as of 05/31/2023.    Allergies (verified) Lisinopril, Elemental sulfur, and Sulfa antibiotics   History: Past Medical History:  Diagnosis Date   FH: heart disease 11/30/2016   Hypertension    History reviewed. No pertinent surgical history. Family History  Problem Relation Age of Onset   Diabetes Father    Heart disease Father    Social History   Socioeconomic History   Marital status: Married    Spouse name: Not on file   Number of children: Not on file   Years of  education: Not on file   Highest education level: Not on file  Occupational History   Not on file  Tobacco Use   Smoking status: Never   Smokeless tobacco: Never  Vaping Use   Vaping status: Not on file  Substance and Sexual Activity   Alcohol use: No   Drug use: No   Sexual activity: Not on file  Other Topics Concern   Not on file  Social History Narrative   Not on file   Social Determinants of Health   Financial Resource Strain: Low Risk  (05/31/2023)   Overall Financial Resource Strain (CARDIA)    Difficulty of Paying Living Expenses: Not hard at all  Food Insecurity: No Food Insecurity (05/31/2023)   Hunger Vital Sign    Worried About Running Out of Food in the Last Year: Never true    Ran Out of Food in the Last Year: Never true  Transportation Needs: No Transportation Needs (05/31/2023)   PRAPARE - Administrator, Civil Service (Medical): No    Lack of Transportation (Non-Medical): No  Physical Activity: Inactive (05/31/2023)   Exercise Vital Sign    Days of Exercise per Week: 0 days    Minutes of Exercise per Session: 0 min  Stress: No Stress Concern Present (05/31/2023)   [employer] of Occupational Health - Occupational Stress Questionnaire    Feeling of Stress : Not at all  Social Connections:  Moderately Isolated (05/31/2023)   Social Connection and Isolation Panel [NHANES]    Frequency of Communication with Friends and Family: Twice a week    Frequency of Social Gatherings with Friends and Family: Twice a week    Attends Religious Services: Never    Database administrator or Organizations: No    Attends Engineer, structural: Never    Marital Status: Married    Tobacco Counseling Counseling given: Yes   Clinical Intake:     Pain : No/denies pain     BMI - recorded: 23.73 Nutritional Status: BMI of 19-24  Normal Nutritional Risks: None Diabetes: No  How often do you need to have someone help you when you read instructions,  pamphlets, or other written materials from your doctor or pharmacy?: 1 - Never  Interpreter Needed?: No  Information entered by :: Arthur Holms   Activities of Daily Living    05/31/2023   10:19 AM  In your present state of health, do you have any difficulty performing the following activities:  Hearing? 0  Vision? 1  Difficulty concentrating or making decisions? 1  Walking or climbing stairs? 0  Dressing or bathing? 0  Doing errands, shopping? 1  Preparing Food and eating ? N  Using the Toilet? N  In the past six months, have you accidently leaked urine? N  Do you have problems with loss of bowel control? N  Managing your Medications? N  Managing your Finances? N  Housekeeping or managing your Housekeeping? N    Patient Care Team: Reubin Milan, MD as PCP - General (Internal Medicine) Sherlon Handing, MD as Consulting Physician (Endocrinology)  Indicate any recent Medical Services you may have received from other than [facility] providers in the past year (date may be approximate).     Assessment:   This is a routine wellness examination for [name].  Hearing/Vision screen Hearing Screening - Comments:: Hearing well over phone Vision Screening - Comments:: Wears readers  Dietary issues and exercise activities discussed:     Goals Addressed             This Visit's Progress    Exercise 3x per week (30 min per time)       Exercise more       Depression Screen    05/31/2023   10:24 AM 11/25/2022    2:04 PM 12/27/2021    1:55 PM 12/01/2021    8:12 AM 04/21/2021   11:24 AM 10/30/2020    9:18 AM 04/21/2020    1:40 PM  PHQ 2/9 Scores  PHQ - 2 Score 0 0 3 0 0 0 0  PHQ- 9 Score  2 4 6  0 0 2    Fall Risk    05/31/2023   10:26 AM 11/25/2022    2:04 PM 12/27/2021    1:55 PM 12/11/2021    6:32 PM 12/01/2021    8:12 AM  Fall Risk   Falls in the past year? 0 1 1 1 1   Number falls in past yr: 0 1 1 1 1   Injury with Fall? 0 1 0 1 0  Risk for fall due to : No Fall Risks  History of fall(s) No Fall Risks  History of fall(s);Impaired balance/gait  Follow up Falls evaluation completed Falls evaluation completed Falls evaluation completed  Falls evaluation completed    MEDICARE RISK AT HOME:  Medicare Risk at Home - 05/31/23 1026     Any stairs in or around the  home? Yes    If so, are there any without handrails? Yes    Home free of loose throw rugs in walkways, pet beds, electrical cords, etc? Yes    Adequate lighting in your home to reduce risk of falls? Yes    Life alert? No   advised to get   Use of a cane, walker or w/c? No    Grab bars in the bathroom? No    Shower chair or bench in shower? No    Elevated toilet seat or a handicapped toilet? No               Cognitive Function:        05/31/2023   10:27 AM 10/30/2020    9:31 AM  6CIT Screen  What Year? 4 points 0 points  What month? 3 points 0 points  What time? 3 points 0 points  Count back from 20 4 points 0 points  Months in reverse 4 points 2 points  Repeat phrase 8 points 8 points  Total Score 26 points 10 points    Immunizations Immunization History  Administered Date(s) Administered   Influenza, High Dose Seasonal PF 06/19/2018   Influenza,inj,Quad PF,6+ Mos 07/19/2019   Influenza-Unspecified 08/04/2020, 08/17/2021   PFIZER(Purple Top)SARS-COV-2 Vaccination 01/15/2020, 02/05/2020   Pneumococcal Conjugate-13 12/04/2014   Pneumococcal Polysaccharide-23 08/23/2016   Td 08/24/2015   Tdap 12/04/2014   Zoster, Live 08/23/2016    TDAP status: Up to date  Flu Vaccine status: Up to date  Pneumococcal vaccine status: Due, Education has been provided regarding the importance of this vaccine. Advised may receive this vaccine at local pharmacy or Health Dept. Aware to provide a copy of the vaccination record if obtained from local pharmacy or Health Dept. Verbalized acceptance and understanding.  Covid-19 vaccine status: Completed vaccines  Qualifies for Shingles Vaccine? Yes    Zostavax completed Yes   Shingrix Completed?: No.    Education has been provided regarding the importance of this vaccine. Patient has been advised to call insurance company to determine out of pocket expense if they have not yet received this vaccine. Advised may also receive vaccine at local pharmacy or Health Dept. Verbalized acceptance and understanding.  Screening Tests Health Maintenance  Topic Date Due   COVID-19 Vaccine (3 - Pfizer risk series) 06/16/2023 (Originally 03/04/2020)   Zoster Vaccines- Shingrix (1 of 2) 08/31/2023 (Originally [birth year]1967)   INFLUENZA VACCINE  01/15/2024 (Originally 05/18/2023)   Colonoscopy  05/30/2024 (Originally 08/30/1993)   Medicare Annual Wellness (AWV)  05/30/2024   DTaP/Tdap/Td (3 - Td or Tdap) 08/23/2025   Pneumonia Vaccine 9+ Years old  Completed   Hepatitis C Screening  Completed   HPV VACCINES  Aged Out    Health Maintenance  There are no preventive care reminders to display for this patient.   Colorectal cancer screening: No longer required.   Lung Cancer Screening: (Low Dose CT Chest recommended if Age 3-80 years, 20 pack-year currently smoking OR have quit w/in 15years.) does not qualify.    Additional Screening:  Hepatitis C Screening: does qualify; Completed 12/20/21  Vision Screening: Recommended annual ophthalmology exams for early detection of glaucoma and other disorders of the eye. Is the patient up to date with their annual eye exam?  Yes  Who is the provider or what is the name of the office in which the patient attends annual eye exams? Dr Clearance Coots   Dental Screening: Recommended annual dental exams for proper oral hygiene   Community Resource Referral /  Chronic Care Management: CRR required this visit?  No   CCM required this visit?  No     Plan:     I have personally reviewed and noted the following in the patient's chart:   Medical and social history Use of alcohol, tobacco or illicit drugs  Current  medications and supplements including opioid prescriptions. Patient is not currently taking opioid prescriptions. Functional ability and status Nutritional status Physical activity Advanced directives List of other physicians Hospitalizations, surgeries, and ER visits in previous 12 months Vitals Screenings to include cognitive, depression, and falls Referrals and appointments  In addition, I have reviewed and discussed with patient certain preventive protocols, quality metrics, and best practice recommendations. A written personalized care plan for preventive services as well as general preventive health recommendations were provided to patient.     Everitt Amber   05/31/2023   After Visit Summary: (MyChart) Due to this being a telephonic visit, the after visit summary with patients personalized plan was offered to patient via MyChart   Nurse Notes: none needed

## 2023-09-02 ENCOUNTER — Other Ambulatory Visit: Payer: Self-pay | Admitting: Internal Medicine

## 2023-09-02 DIAGNOSIS — I10 Essential (primary) hypertension: Secondary | ICD-10-CM

## 2023-09-04 NOTE — Telephone Encounter (Signed)
Requested Prescriptions  Pending Prescriptions Disp Refills   metoprolol tartrate (LOPRESSOR) 25 MG tablet [Pharmacy Med Name: METOPROLOL TARTRATE 25MG  TABLETS] 90 tablet 0    Sig: TAKE 1 TABLET(25 MG) BY MOUTH DAILY     Cardiovascular:  Beta Blockers Failed - 09/02/2023  3:33 AM      Failed - Last BP in normal range    BP Readings from Last 1 Encounters:  11/25/22 (!) 132/90         Passed - Last Heart Rate in normal range    Pulse Readings from Last 1 Encounters:  11/25/22 (!) 56         Passed - Valid encounter within last 6 months    Recent Outpatient Visits           9 months ago Facial laceration, initial encounter   [place] [facility] at [facility], Nyoka Cowden, MD   1 year ago Acute metabolic encephalopathy   [place] [facility] at [facility], Nyoka Cowden, MD   1 year ago Essential hypertension   [place] [facility] at [facility], Nyoka Cowden, MD   2 years ago Acute hip pain, left   [facility] at [facility], Nyoka Cowden, MD   2 years ago Essential hypertension   [facility] at [facility], Nyoka Cowden, MD

## 2023-12-23 ENCOUNTER — Other Ambulatory Visit: Payer: Self-pay | Admitting: Internal Medicine

## 2023-12-23 DIAGNOSIS — I10 Essential (primary) hypertension: Secondary | ICD-10-CM

## 2023-12-25 NOTE — Telephone Encounter (Signed)
 Requested medication (s) are due for refill today: yes  Requested medication (s) are on the active medication list: yes  Last refill:  09/04/23  Future visit scheduled: no  Notes to clinic:  Unable to refill per protocol, courtesy refill already given, routing for provider approval.      Requested Prescriptions  Pending Prescriptions Disp Refills   metoprolol tartrate (LOPRESSOR) 25 MG tablet [Pharmacy Med Name: METOPROLOL TARTRATE 25MG  TABLETS] 90 tablet 0    Sig: TAKE 1 TABLET(25 MG) BY MOUTH DAILY     Cardiovascular:  Beta Blockers Failed - 12/25/2023 12:51 PM      Failed - Last BP in normal range    BP Readings from Last 1 Encounters:  11/25/22 (!) 132/90         Failed - Valid encounter within last 6 months    Recent Outpatient Visits           1 year ago Facial laceration, initial encounter   [place] [facility] at [facility], Nyoka Cowden, MD   1 year ago Acute metabolic encephalopathy   [place] [facility] at [facility], Nyoka Cowden, MD   2 years ago Essential hypertension   [place] [facility] at [facility], Nyoka Cowden, MD   2 years ago Acute hip pain, left   [facility] at [facility], Nyoka Cowden, MD   3 years ago Essential hypertension   [place] [facility] at [facility], Nyoka Cowden, MD              Passed - Last Heart Rate in normal range    Pulse Readings from Last 1 Encounters:  11/25/22 (!) 56
